# Patient Record
Sex: Female | Born: 1965 | Race: White | Hispanic: No | Marital: Married | Smoking: Never smoker
Health system: Southern US, Community
[De-identification: ages and names within clinical notes are randomized; demographics above are authoritative.]

## PROBLEM LIST (undated history)

## (undated) DIAGNOSIS — Z5189 Encounter for other specified aftercare: Secondary | ICD-10-CM

## (undated) DIAGNOSIS — Z959 Presence of cardiac and vascular implant and graft, unspecified: Secondary | ICD-10-CM

## (undated) DIAGNOSIS — M25531 Pain in right wrist: Secondary | ICD-10-CM

## (undated) DIAGNOSIS — T4145XA Adverse effect of unspecified anesthetic, initial encounter: Secondary | ICD-10-CM

## (undated) DIAGNOSIS — Q969 Turner's syndrome, unspecified: Secondary | ICD-10-CM

## (undated) DIAGNOSIS — Z974 Presence of external hearing-aid: Secondary | ICD-10-CM

## (undated) DIAGNOSIS — M160 Bilateral primary osteoarthritis of hip: Secondary | ICD-10-CM

## (undated) DIAGNOSIS — I442 Atrioventricular block, complete: Secondary | ICD-10-CM

## (undated) DIAGNOSIS — R011 Cardiac murmur, unspecified: Secondary | ICD-10-CM

## (undated) DIAGNOSIS — T8859XA Other complications of anesthesia, initial encounter: Secondary | ICD-10-CM

## (undated) DIAGNOSIS — Z8774 Personal history of (corrected) congenital malformations of heart and circulatory system: Secondary | ICD-10-CM

## (undated) DIAGNOSIS — J189 Pneumonia, unspecified organism: Secondary | ICD-10-CM

## (undated) DIAGNOSIS — D649 Anemia, unspecified: Secondary | ICD-10-CM

## (undated) DIAGNOSIS — I509 Heart failure, unspecified: Secondary | ICD-10-CM

## (undated) DIAGNOSIS — Z952 Presence of prosthetic heart valve: Secondary | ICD-10-CM

## (undated) DIAGNOSIS — R918 Other nonspecific abnormal finding of lung field: Secondary | ICD-10-CM

## (undated) DIAGNOSIS — Z9889 Other specified postprocedural states: Secondary | ICD-10-CM

## (undated) DIAGNOSIS — Z950 Presence of cardiac pacemaker: Secondary | ICD-10-CM

## (undated) DIAGNOSIS — Z8679 Personal history of other diseases of the circulatory system: Secondary | ICD-10-CM

## (undated) DIAGNOSIS — Z8709 Personal history of other diseases of the respiratory system: Secondary | ICD-10-CM

## (undated) DIAGNOSIS — I428 Other cardiomyopathies: Secondary | ICD-10-CM

## (undated) DIAGNOSIS — I5020 Unspecified systolic (congestive) heart failure: Secondary | ICD-10-CM

## (undated) DIAGNOSIS — I10 Essential (primary) hypertension: Secondary | ICD-10-CM

## (undated) DIAGNOSIS — R112 Nausea with vomiting, unspecified: Secondary | ICD-10-CM

## (undated) DIAGNOSIS — T7840XA Allergy, unspecified, initial encounter: Secondary | ICD-10-CM

## (undated) DIAGNOSIS — E785 Hyperlipidemia, unspecified: Secondary | ICD-10-CM

## (undated) DIAGNOSIS — I5042 Chronic combined systolic (congestive) and diastolic (congestive) heart failure: Secondary | ICD-10-CM

## (undated) HISTORY — DX: Presence of prosthetic heart valve: Z95.2

## (undated) HISTORY — DX: Turner's syndrome, unspecified: Q96.9

## (undated) HISTORY — DX: Other cardiomyopathies: I42.8

## (undated) HISTORY — DX: Presence of cardiac and vascular implant and graft, unspecified: Z95.9

## (undated) HISTORY — DX: Unspecified systolic (congestive) heart failure: I50.20

## (undated) HISTORY — DX: Bilateral primary osteoarthritis of hip: M16.0

## (undated) HISTORY — PX: EYE SURGERY: SHX253

## (undated) HISTORY — DX: Cardiac murmur, unspecified: R01.1

## (undated) HISTORY — PX: CARDIAC ASSIST DEVICE REMOVAL: SHX1289

## (undated) HISTORY — DX: Other nonspecific abnormal finding of lung field: R91.8

## (undated) HISTORY — DX: Allergy, unspecified, initial encounter: T78.40XA

## (undated) HISTORY — DX: Personal history of (corrected) congenital malformations of heart and circulatory system: Z87.74

## (undated) HISTORY — PX: TONSILLECTOMY: SUR1361

## (undated) HISTORY — DX: Hyperlipidemia, unspecified: E78.5

## (undated) HISTORY — PX: DENTAL SURGERY: SHX609

## (undated) HISTORY — DX: Anemia, unspecified: D64.9

## (undated) HISTORY — DX: Chronic combined systolic (congestive) and diastolic (congestive) heart failure: I50.42

## (undated) HISTORY — PX: PACEMAKER INSERTION: SHX728

## (undated) HISTORY — DX: Encounter for other specified aftercare: Z51.89

---

## 2017-01-06 DIAGNOSIS — Z8679 Personal history of other diseases of the circulatory system: Secondary | ICD-10-CM

## 2017-01-06 DIAGNOSIS — Z8674 Personal history of sudden cardiac arrest: Secondary | ICD-10-CM

## 2017-01-06 HISTORY — DX: Personal history of other diseases of the circulatory system: Z86.79

## 2017-01-06 HISTORY — DX: Personal history of sudden cardiac arrest: Z86.74

## 2017-01-11 DIAGNOSIS — R55 Syncope and collapse: Secondary | ICD-10-CM | POA: Insufficient documentation

## 2017-01-12 DIAGNOSIS — I472 Ventricular tachycardia, unspecified: Secondary | ICD-10-CM | POA: Insufficient documentation

## 2017-01-12 DIAGNOSIS — I359 Nonrheumatic aortic valve disorder, unspecified: Secondary | ICD-10-CM | POA: Insufficient documentation

## 2017-10-06 DIAGNOSIS — T827XXA Infection and inflammatory reaction due to other cardiac and vascular devices, implants and grafts, initial encounter: Secondary | ICD-10-CM | POA: Insufficient documentation

## 2017-10-06 DIAGNOSIS — I10 Essential (primary) hypertension: Secondary | ICD-10-CM | POA: Insufficient documentation

## 2017-10-06 DIAGNOSIS — I428 Other cardiomyopathies: Secondary | ICD-10-CM | POA: Insufficient documentation

## 2017-10-08 DIAGNOSIS — I469 Cardiac arrest, cause unspecified: Secondary | ICD-10-CM | POA: Insufficient documentation

## 2017-10-08 DIAGNOSIS — Z8774 Personal history of (corrected) congenital malformations of heart and circulatory system: Secondary | ICD-10-CM | POA: Insufficient documentation

## 2017-10-08 DIAGNOSIS — Z95810 Presence of automatic (implantable) cardiac defibrillator: Secondary | ICD-10-CM | POA: Insufficient documentation

## 2017-10-08 DIAGNOSIS — Q969 Turner's syndrome, unspecified: Secondary | ICD-10-CM | POA: Insufficient documentation

## 2018-02-27 ENCOUNTER — Other Ambulatory Visit: Payer: Self-pay

## 2018-02-27 ENCOUNTER — Ambulatory Visit: Payer: Managed Care, Other (non HMO) | Admitting: Family Medicine

## 2018-02-27 ENCOUNTER — Encounter: Payer: Self-pay | Admitting: Family Medicine

## 2018-02-27 VITALS — BP 118/78 | HR 100 | Temp 99.1°F | Ht 60.0 in | Wt 104.6 lb

## 2018-02-27 DIAGNOSIS — Z8774 Personal history of (corrected) congenital malformations of heart and circulatory system: Secondary | ICD-10-CM | POA: Diagnosis not present

## 2018-02-27 DIAGNOSIS — M160 Bilateral primary osteoarthritis of hip: Secondary | ICD-10-CM | POA: Diagnosis not present

## 2018-02-27 DIAGNOSIS — I428 Other cardiomyopathies: Secondary | ICD-10-CM

## 2018-02-27 DIAGNOSIS — Z952 Presence of prosthetic heart valve: Secondary | ICD-10-CM

## 2018-02-27 DIAGNOSIS — I5022 Chronic systolic (congestive) heart failure: Secondary | ICD-10-CM | POA: Diagnosis not present

## 2018-02-27 DIAGNOSIS — Z950 Presence of cardiac pacemaker: Secondary | ICD-10-CM

## 2018-02-27 DIAGNOSIS — Q969 Turner's syndrome, unspecified: Secondary | ICD-10-CM | POA: Diagnosis not present

## 2018-02-27 NOTE — Patient Instructions (Signed)
     IF you received an x-ray today, you will receive an invoice from [facility]. Please contact [facility] at [phone] with questions or concerns regarding your invoice.   IF you received labwork today, you will receive an invoice from [facility]. Please contact [facility] at [phone] with questions or concerns regarding your invoice.   Our billing staff will not be able to assist you with questions regarding bills from these companies.  You will be contacted with the lab results as soon as they are available. The fastest way to get your results is to activate your My Chart account. Instructions are located on the last page of this paperwork. If you have not heard from us regarding the results in 2 weeks, please contact this office.     

## 2018-02-27 NOTE — Progress Notes (Signed)
3/25/201910:33 AM  Michelle Lloyd 1966/04/25, 52 y.o. female [id number]  Chief Complaint  Patient presents with  . Establish Care    Needing to establish care. Also needing referral for cardiology. Has pacemaker in since 2017    HPI:   Patient is a 52 y.o. female who presents today to establish care. Needs referrals for cards, ep and ortho.  She recently moved into the area She was hospitalized in oct/nov 2018 at [facility] for removal and replacement of pacemaker due to infection Echo done at that time showed LVEF 25-30% She has not seen cardiology since then, per her insurance she needs referrals She would like to continue with EP from [facility] She also needs a referral for ortho She has bilateral hip OA, told she needs replacement, not interested, treated with stem cell injections, helped some, plan was to repeat, uses a cane Has no acute concerns today  Depression screen [facility] 2/9 02/27/2018  Decreased Interest 0  Down, Depressed, Hopeless 0  PHQ - 2 Score 0    Allergies  Allergen Reactions  . Lisinopril     Prior to Admission medications   Medication Sig Start Date End Date Taking? Authorizing Provider  co-enzyme Q-10 30 MG capsule Take 30 mg by mouth 3 (three) times daily.   Yes [provider]  ferrous sulfate 325 (65 FE) MG tablet Take 325 mg by mouth daily with breakfast.   Yes [provider]  furosemide (LASIX) 20 MG tablet Take 20 mg by mouth.   Yes [provider]  losartan (COZAAR) 25 MG tablet Take 25 mg by mouth daily.   Yes [provider]  magnesium 30 MG tablet Take 30 mg by mouth 2 (two) times daily.   Yes [provider]  metoprolol tartrate (LOPRESSOR) 25 MG tablet Take 25 mg by mouth 2 (two) times daily.   Yes [provider]  omega-3 acid ethyl esters (LOVAZA) 1 g capsule Take by mouth 2 (two) times daily.   Yes [provider]  vitamin C (ASCORBIC ACID) 500 MG tablet Take 500 mg by  mouth daily.   Yes [provider]    Past Medical History:  Diagnosis Date  . Allergy   . Anemia   . Bilateral primary osteoarthritis of hip   . Blood transfusion without reported diagnosis   . Cardiac device in situ   . H/O prosthetic aortic valve replacement   . Heart murmur   . History of repair of coarctation of aorta   . Hyperlipidemia   . Non-ischemic cardiomyopathy (HCC)   . Systolic heart failure (HCC)   . Turner syndrome     Past Surgical History:  Procedure Laterality Date  . CARDIAC ASSIST DEVICE REMOVAL    . EYE SURGERY    . PACEMAKER INSERTION      Social History   Tobacco Use  . Smoking status: Never Smoker  . Smokeless tobacco: Never Used  Substance Use Topics  . Alcohol use: Not on file    Family History  Problem Relation Age of Onset  . Cancer Mother   . Stroke Father   . Healthy Sister   . Healthy Brother     Review of Systems  Constitutional: Negative for chills, fever, malaise/fatigue and weight loss.  Respiratory: Negative for cough and shortness of breath.   Cardiovascular: Positive for palpitations (occasional). Negative for chest pain, orthopnea, leg swelling and PND.  Gastrointestinal: Negative for abdominal pain, nausea and vomiting.  Genitourinary: Negative  for dysuria and hematuria.  Musculoskeletal: Positive for joint pain.  All other systems reviewed and are negative.    OBJECTIVE:  Blood pressure 118/78, pulse 100, temperature 99.1 F (37.3 C), temperature source Oral, height 5' (1.524 m), weight 104 lb 9.6 oz (47.4 kg), SpO2 98 %.  Physical Exam  Constitutional: She is oriented to person, place, and time and well-developed, well-nourished, and in no distress.  HENT:  Head: Normocephalic and atraumatic.  Mouth/Throat: Oropharynx is clear and moist. No oropharyngeal exudate.  Eyes: Pupils are equal, round, and reactive to light. EOM are normal. No scleral icterus.  Neck: Neck supple.  Cardiovascular: Normal  rate, regular rhythm and intact distal pulses. Exam reveals no gallop and no friction rub.  Murmur heard. Pulmonary/Chest: Effort normal and breath sounds normal. She has no wheezes. She has no rales.  Musculoskeletal: She exhibits no edema.  Neurological: She is alert and oriented to person, place, and time. Gait normal.  Skin: Skin is warm and dry.    ASSESSMENT and PLAN  1. Pacemaker - Ambulatory referral to Cardiac Electrophysiology  2. Chronic systolic heart failure (HCC) - Ambulatory referral to Cardiology - Ambulatory referral to Cardiac Electrophysiology - CBC - Comprehensive metabolic panel - Lipid panel - TSH  3. Non-ischemic cardiomyopathy (HCC) - Ambulatory referral to Cardiology - Ambulatory referral to Cardiac Electrophysiology  4. Status post aortic valve replacement with prosthetic valve - Ambulatory referral to Cardiology - Ambulatory referral to Cardiac Electrophysiology  5. S/P repair of coarctation of aorta - Ambulatory referral to Cardiology - Ambulatory referral to Cardiac Electrophysiology  6. Turner syndrome - Ambulatory referral to Cardiology - Ambulatory referral to Cardiac Electrophysiology  7. Bilateral primary osteoarthritis of hip - AMB referral to orthopedics    Return in about 3 months (around 05/30/2018).    Myles Lipps, MD Primary Care at [facility] [address] [place], [place] [address] Ph.  [phone] Fax [phone]

## 2018-02-28 LAB — COMPREHENSIVE METABOLIC PANEL
ALT: 19 IU/L (ref 0–32)
AST: 26 IU/L (ref 0–40)
Albumin/Globulin Ratio: 1.7 (ref 1.2–2.2)
Albumin: 4.5 g/dL (ref 3.5–5.5)
Alkaline Phosphatase: 78 IU/L (ref 39–117)
BUN/Creatinine Ratio: 29 — ABNORMAL HIGH (ref 9–23)
BUN: 20 mg/dL (ref 6–24)
Bilirubin Total: 0.4 mg/dL (ref 0.0–1.2)
CO2: 22 mmol/L (ref 20–29)
Calcium: 9.7 mg/dL (ref 8.7–10.2)
Chloride: 100 mmol/L (ref 96–106)
Creatinine, Ser: 0.68 mg/dL (ref 0.57–1.00)
GFR calc Af Amer: 115 mL/min/{1.73_m2} (ref >59)
GFR calc non Af Amer: 99 mL/min/{1.73_m2} (ref >59)
Globulin, Total: 2.7 g/dL (ref 1.5–4.5)
Glucose: 91 mg/dL (ref 65–99)
Potassium: 4.4 mmol/L (ref 3.5–5.2)
Sodium: 140 mmol/L (ref 134–144)
Total Protein: 7.2 g/dL (ref 6.0–8.5)

## 2018-02-28 LAB — LIPID PANEL
Chol/HDL Ratio: 3 ratio (ref 0.0–4.4)
Cholesterol, Total: 238 mg/dL — ABNORMAL HIGH (ref 100–199)
HDL: 80 mg/dL (ref >39)
LDL Calculated: 131 mg/dL — ABNORMAL HIGH (ref 0–99)
Triglycerides: 135 mg/dL (ref 0–149)
VLDL Cholesterol Cal: 27 mg/dL (ref 5–40)

## 2018-02-28 LAB — CBC
Hematocrit: 37.9 % (ref 34.0–46.6)
Hemoglobin: 12.5 g/dL (ref 11.1–15.9)
MCH: 30.6 pg (ref 26.6–33.0)
MCHC: 33 g/dL (ref 31.5–35.7)
MCV: 93 fL (ref 79–97)
Platelets: 235 10*3/uL (ref 150–379)
RBC: 4.08 x10E6/uL (ref 3.77–5.28)
RDW: 13.1 % (ref 12.3–15.4)
WBC: 5.5 10*3/uL (ref 3.4–10.8)

## 2018-02-28 LAB — TSH: TSH: 1.22 u[IU]/mL (ref 0.450–4.500)

## 2018-03-07 ENCOUNTER — Ambulatory Visit (INDEPENDENT_AMBULATORY_CARE_PROVIDER_SITE_OTHER): Payer: Managed Care, Other (non HMO) | Admitting: Orthopaedic Surgery

## 2018-03-17 ENCOUNTER — Encounter: Payer: Self-pay | Admitting: Family Medicine

## 2018-04-04 ENCOUNTER — Other Ambulatory Visit: Payer: Self-pay

## 2018-04-04 MED ORDER — LOSARTAN POTASSIUM 25 MG PO TABS: 25.0000 mg | ORAL_TABLET | Freq: Every day | ORAL | 0 refills | Status: DC

## 2018-04-04 NOTE — Telephone Encounter (Signed)
30 day supply of losartan 25mg  pended for provider signature. Please refill if appropriate.

## 2018-04-04 NOTE — Telephone Encounter (Signed)
Copied from CRM [id number]. Topic: General - Other >> Apr 04, 2018  4:04 PM Elliot Gault wrote: Relation to pt: self  Call back number: losartan (COZAAR) 25 MG tablet  Pharmacy: [id number] [place], [place] - [address] [phone] (Phone) [phone] (Fax)  Reason for call:  Patient requesting a 2 week supply of losartan (COZAAR) 25 MG tablet to hold her over until new patient cardiologist appointment, patient states if PCP will not refill is okay for patient to be without for 14 days until [facility] with specialist, please advise >> Apr 04, 2018  4:11 PM Elliot Gault wrote: Relation to pt: self  Call back number: losartan (COZAAR) 25 MG tablet  Pharmacy: [id number] [place], [place] - [address] [phone] (Phone) [phone] (Fax)  Reason for call:  Patient requesting a 2 week supply of losartan (COZAAR) 25 MG tablet to hold her over until new patient cardiologist appointment, patient states if PCP will not refill is okay for patient to be without for 14 days until [facility] with specialist, please advise

## 2018-04-18 ENCOUNTER — Ambulatory Visit: Payer: Managed Care, Other (non HMO) | Admitting: Cardiovascular Disease

## 2018-04-18 ENCOUNTER — Encounter: Payer: Self-pay | Admitting: Cardiovascular Disease

## 2018-04-18 VITALS — BP 122/62 | HR 78 | Ht 60.0 in | Wt 105.6 lb

## 2018-04-18 DIAGNOSIS — Q251 Coarctation of aorta: Secondary | ICD-10-CM

## 2018-04-18 DIAGNOSIS — E7800 Pure hypercholesterolemia, unspecified: Secondary | ICD-10-CM

## 2018-04-18 DIAGNOSIS — I10 Essential (primary) hypertension: Secondary | ICD-10-CM | POA: Diagnosis not present

## 2018-04-18 DIAGNOSIS — I5022 Chronic systolic (congestive) heart failure: Secondary | ICD-10-CM

## 2018-04-18 DIAGNOSIS — I493 Ventricular premature depolarization: Secondary | ICD-10-CM

## 2018-04-18 NOTE — Patient Instructions (Addendum)
Medication Instructions:  Your physician recommends that you continue on your current medications as directed. Please refer to the Current Medication list given to you today. CALL YOUR PHARMACY TO MAKE SURE YOU LOSARTAN WAS NOT RECALLED   Labwork: NONE  Testing/Procedures: NONE  Follow-Up: Your physician wants you to follow-up in: 6 MONTH OV  You will receive a reminder letter in the mail two months in advance. If you don't receive a letter, please call our office to schedule the follow-up appointment.  DR DAUBERT'S OFFICE 05/17/18 10:00 [phone] OPT 2 IF YOU NEED TO RESCHEDULE   If you need a refill on your cardiac medications before your next appointment, please call your pharmacy.

## 2018-04-18 NOTE — Progress Notes (Signed)
Cardiology Office Note   Date:  04/18/2018   ID:  Michelle Lloyd, DOB 01-15-1966, MRN [id number]  PCP:  Myles Lipps, MD  Cardiologist:   Chilton Si, MD  Electrophysiologist: Dr. Aron Baba, [facility]  No chief complaint on file.    History of Present Illness: Michelle Lloyd is a 52 y.o. female with chronic systolic and diastolic heart failure, Turner syndrome, status post repair of coarctation of the aorta, bicuspid aortic valve status post aortic valve replacement, ventricular tachycardia s/p ICD, and hypertension who presents to establish care.  Michelle Lloyd underwent repair of coarctation of the aorta in 1980 with a gortex graft.  She developed nonischemic cardiomyopathy, most recent echo reportedly revealed LVEF 30-35%.  She is also had ventricular tachycardia and had a biventricular ICD placed 02/2011.  She had an RV lead fracture and a left innominate vein occlusion in 2016.  She underwent laser lead extraction and she subsequently underwent implantation of a right-sided Biotronik ICD 06/2015.  Her LVEF improved to 50% despite downgrade to a single lead ICD.  She had a device infection 09/2017.  Her ICD was removed and replaced at [facility].  She had a repeat echo 11/2017 that reportedly showed a reduction in her LVEF to 30%.  She is to follow up with Dr. Alden Hipp to be upgraded back to a Bi-V ICED.  Michelle Lloyd had a bicuspid aortic valve and underwent bioprosthetic AVR in 2012.  Since being discharged from [facility] Michelle Lloyd has been feeling well.  She has no lower extremity edema, orthopnea or PND.  She doesn't get much exercise 2/2 hip pain.  She hopes to get back on the treadmill and in the pool soon.  She last had cholesterol checked 02/2018 and increased Omega 3 due to elevated cholesterol levels.  She is otherwise without complaint.  She is accompanied by her husband and has 2 adopted daughters ages 4 and 64.    Past Medical History:  Diagnosis Date  . Allergy   .  Anemia   . Bilateral primary osteoarthritis of hip   . Blood transfusion without reported diagnosis   . Cardiac device in situ   . H/O prosthetic aortic valve replacement   . Heart murmur   . History of repair of coarctation of aorta   . Hyperlipidemia   . Non-ischemic cardiomyopathy (HCC)   . Systolic heart failure (HCC)   . Turner syndrome     Past Surgical History:  Procedure Laterality Date  . CARDIAC ASSIST DEVICE REMOVAL    . EYE SURGERY    . PACEMAKER INSERTION       Current Outpatient Medications  Medication Sig Dispense Refill  . co-enzyme Q-10 30 MG capsule Take 30 mg by mouth 3 (three) times daily.    . ferrous sulfate 325 (65 FE) MG tablet Take 325 mg by mouth daily with breakfast.    . furosemide (LASIX) 20 MG tablet Take 20 mg by mouth.    . losartan (COZAAR) 25 MG tablet Take 1 tablet (25 mg total) by mouth daily. 30 tablet 0  . magnesium 30 MG tablet Take 30 mg by mouth 2 (two) times daily.    . metoprolol tartrate (LOPRESSOR) 25 MG tablet Take 25 mg by mouth 2 (two) times daily.    Marland Kitchen omega-3 acid ethyl esters (LOVAZA) 1 g capsule Take by mouth 2 (two) times daily.    . vitamin C (ASCORBIC ACID) 500 MG tablet Take 500 mg by mouth daily.  No current facility-administered medications for this visit.     Allergies:   Lisinopril    Social History:  The patient  reports that she has never smoked. She has never used smokeless tobacco. She reports that she drank alcohol. She reports that she does not use drugs.   Family History:  The patient's family history includes Atrial fibrillation in her father; Breast cancer in her mother; Cataracts in her mother; Kidney disease in her sister; Stroke in her father.    ROS:  Please see the history of present illness.   Otherwise, review of systems are positive for none.   All other systems are reviewed and negative.    PHYSICAL EXAM: VS:  BP 122/62   Pulse 78   Ht 5' (1.524 m)   Wt 105 lb 9.6 oz (47.9 kg)   BMI  20.62 kg/m  , BMI Body mass index is 20.62 kg/m. GENERAL:  Well appearing.  No acute distress. HEENT:  Pupils equal round and reactive, fundi not visualized, oral mucosa unremarkable NECK:  No jugular venous distention, waveform within normal limits, carotid upstroke brisk and symmetric, no bruits.  Webbed neck.  Low posterior hairline. LUNGS:  Clear to auscultation bilaterally HEART:  RRR.  PMI not displaced or sustained,S1 and S2 within normal limits, no S3, no S4, no clicks, no rubs, III/VI systolic murmur at LUSB ABD:  Flat, positive bowel sounds normal in frequency in pitch, no bruits, no rebound, no guarding, no midline pulsatile mass, no hepatomegaly, no splenomegaly EXT:  2 plus pulses throughout, no edema, no cyanosis no clubbing.  Kyphosis SKIN:  No rashes no nodules NEURO:  Cranial nerves II through XII grossly intact, motor grossly intact throughout.   Frequent word finding difficulties.  PSYCH:  Cognitively intact, oriented to person place and time   EKG:  EKG is ordered today. The ekg ordered today demonstrates sinus rhythm.  V pacing. Rate 78 bpm.   Recent Labs: 02/27/2018: ALT 19; BUN 20; Creatinine, Ser 0.68; Hemoglobin 12.5; Platelets 235; Potassium 4.4; Sodium 140; TSH 1.220    Lipid Panel    Component Value Date/Time   CHOL 238 (H) 02/27/2018 1153   TRIG 135 02/27/2018 1153   HDL 80 02/27/2018 1153   CHOLHDL 3.0 02/27/2018 1153   LDLCALC 131 (H) 02/27/2018 1153      Wt Readings from Last 3 Encounters:  04/18/18 105 lb 9.6 oz (47.9 kg)  02/27/18 104 lb 9.6 oz (47.4 kg)      ASSESSMENT AND PLAN:  # Chronic systolic and diastolic heart failure: # VT: # S/p ICD:  Michelle Lloyd appears to be euvolemic.  Her last echo revealed reduced systolic function.  She will be going to Dr. Alden Hipp for BiV upgrade.  She will then follow up with our EP department for device management.  [facility] will certainly repeat an echo so we will not do so at this time.  # Bicuspid  aortic valve:  # S/p bioprosthetic AVR.   Echo as above.   # Coarctation of the aorta: S/p Repair.  Unclear when this was last imaged.  We will get her records and repeat CT-A of the chest if she hasn't had one recently.  # Hypertension: BP initially elevated and improved on repeat.  No difference in either arm.  Continue losartan and metoprolol.   # Hyperlipidemia: ASCVD 10 year risk 1.7%.  Continue omega 3.    Current medicines are reviewed at length with the patient today.  The patient does  not have concerns regarding medicines.  The following changes have been made:  no change  Labs/ tests ordered today include:  No orders of the defined types were placed in this encounter.    Disposition:   FU with Mckenzi Buonomo C. [facility] Salvia, MD, [facility] in 6 months.      Signed, Monicka Cyran C. [facility] Salvia, MD, [facility]  04/18/2018 5:35 PM    [facility]

## 2018-05-30 ENCOUNTER — Ambulatory Visit: Payer: Managed Care, Other (non HMO) | Admitting: Family Medicine

## 2018-06-06 ENCOUNTER — Ambulatory Visit: Payer: Self-pay | Admitting: Family Medicine

## 2018-07-06 DIAGNOSIS — Z959 Presence of cardiac and vascular implant and graft, unspecified: Secondary | ICD-10-CM

## 2018-07-06 HISTORY — DX: Presence of cardiac and vascular implant and graft, unspecified: Z95.9

## 2018-07-26 ENCOUNTER — Telehealth: Payer: Self-pay | Admitting: Cardiovascular Disease

## 2018-07-26 NOTE — Telephone Encounter (Signed)
Spoke to patient she states she was not sure who she was ask about  The timeframe   For hip surgery. Patient states  [facility] physician was aware but informed her  To "lets get  bivi upgrade " she has an post follow up appointment in sept 2019. RN informed patient to ask  Dr Alden Hipp ( [facility]) at next appointment)  patient aware will also defer question to Dr [facility] Salvia.

## 2018-07-26 NOTE — Telephone Encounter (Signed)
New Message    Patient is calling because she just recently had a pacemaker. She wants to know what the timeframe should be before she has a hip replacement. Please call.

## 2018-07-27 ENCOUNTER — Other Ambulatory Visit: Payer: Self-pay

## 2018-07-27 ENCOUNTER — Ambulatory Visit: Payer: Managed Care, Other (non HMO) | Admitting: Family Medicine

## 2018-07-27 ENCOUNTER — Encounter: Payer: Self-pay | Admitting: Family Medicine

## 2018-07-27 VITALS — BP 108/62 | HR 64 | Temp 98.5°F | Ht 60.0 in | Wt 106.2 lb

## 2018-07-27 DIAGNOSIS — M160 Bilateral primary osteoarthritis of hip: Secondary | ICD-10-CM

## 2018-07-27 DIAGNOSIS — I10 Essential (primary) hypertension: Secondary | ICD-10-CM | POA: Diagnosis not present

## 2018-07-27 DIAGNOSIS — Z95810 Presence of automatic (implantable) cardiac defibrillator: Secondary | ICD-10-CM

## 2018-07-27 NOTE — Patient Instructions (Signed)
° ° ° °  If you have lab work done today you will be contacted with your lab results within the next 2 weeks.  If you have not heard from us then please contact us. The fastest way to get your results is to register for My Chart. ° ° °IF you received an x-ray today, you will receive an invoice from [facility]. Please contact [facility] at [phone] with questions or concerns regarding your invoice.  ° °IF you received labwork today, you will receive an invoice from [facility]. Please contact [facility] at [phone] with questions or concerns regarding your invoice.  ° °Our billing staff will not be able to assist you with questions regarding bills from these companies. ° °You will be contacted with the lab results as soon as they are available. The fastest way to get your results is to activate your My Chart account. Instructions are located on the last page of this paperwork. If you have not heard from us regarding the results in 2 weeks, please contact this office. °  ° ° ° °

## 2018-07-27 NOTE — Progress Notes (Signed)
8/22/201910:09 AM  Michelle Lloyd 1966-11-08, 52 y.o. female [id number]  Chief Complaint  Patient presents with  . Wound Check    pacemaker put in on the 1st of Aug, need wound chk. Has surgical strips on today. they are to fall off on its on    HPI:   Patient is a 52 y.o. female with past medical history significant for turners syndrome, repair of coarctation of aorta, HFrEF - 30%, h/o ICD in place, hip OA who presents today for followup  Patient had infection of original pacemaker, reimplanted with single chamber ICD However had declining EF and increased sx so ICD upgraded to biventricular ICD, right chest, on 07/06/18  Patient is feeling well Denies any concerns for wound infection Steri-stripes still in place  Dr Alden Hipp, EP at [facility]  Last viist 07/06/18, sees him again in Sept Dr Chilton Si, cards  Requesting referral for ortho for hip replacement eval, known bilateral OA, left worse than right Uses a cane for walking Per patient, she should be ok after 6 weeks pacemaker placement  Fall Risk  07/27/2018 02/27/2018  Falls in the past year? No No     Depression screen [facility] 2/9 07/27/2018 02/27/2018  Decreased Interest 0 0  Down, Depressed, Hopeless 0 0  PHQ - 2 Score 0 0    Allergies  Allergen Reactions  . Lisinopril     Prior to Admission medications   Medication Sig Start Date End Date Taking? Authorizing Provider  co-enzyme Q-10 30 MG capsule Take 30 mg by mouth 3 (three) times daily.   Yes [provider]  ferrous sulfate 325 (65 FE) MG tablet Take 325 mg by mouth daily with breakfast.   Yes [provider]  furosemide (LASIX) 20 MG tablet Take 20 mg by mouth.   Yes [provider]  losartan (COZAAR) 25 MG tablet Take 1 tablet (25 mg total) by mouth daily. 04/04/18  Yes Myles Lipps, MD  magnesium 30 MG tablet Take 30 mg by mouth 2 (two) times daily.   Yes [provider]  metoprolol tartrate (LOPRESSOR) 25 MG  tablet Take 25 mg by mouth 2 (two) times daily.   Yes [provider]  omega-3 acid ethyl esters (LOVAZA) 1 g capsule Take by mouth 2 (two) times daily.   Yes [provider]  vitamin C (ASCORBIC ACID) 500 MG tablet Take 500 mg by mouth daily.   Yes [provider]    Past Medical History:  Diagnosis Date  . Allergy   . Anemia   . Bilateral primary osteoarthritis of hip   . Blood transfusion without reported diagnosis   . Cardiac device in situ   . H/O prosthetic aortic valve replacement   . Heart murmur   . History of repair of coarctation of aorta   . Hyperlipidemia   . Non-ischemic cardiomyopathy (HCC)   . Systolic heart failure (HCC)   . Turner syndrome   . Turner's syndrome     Past Surgical History:  Procedure Laterality Date  . CARDIAC ASSIST DEVICE REMOVAL    . EYE SURGERY    . PACEMAKER INSERTION      Social History   Tobacco Use  . Smoking status: Never Smoker  . Smokeless tobacco: Never Used  Substance Use Topics  . Alcohol use: Not Currently    Family History  Problem Relation Age of Onset  . Breast cancer Mother   . Cataracts Mother   . Stroke Father   .  Atrial fibrillation Father   . Kidney disease Sister     Review of Systems  Constitutional: Negative for chills, fever and malaise/fatigue.  Respiratory: Negative for cough and shortness of breath.   Cardiovascular: Negative for chest pain, palpitations and leg swelling.  Gastrointestinal: Negative for abdominal pain, nausea and vomiting.     OBJECTIVE:  Blood pressure 108/62, pulse 64, temperature 98.5 F (36.9 C), temperature source Oral, height 5' (1.524 m), weight 106 lb 3.2 oz (48.2 kg), SpO2 99 %. Body mass index is 20.74 kg/m.   Physical Exam  Constitutional: She is oriented to person, place, and time. She appears well-developed and well-nourished.  HENT:  Head: Normocephalic and atraumatic.  Mouth/Throat: Oropharynx is clear and moist. No oropharyngeal  exudate.  Eyes: Pupils are equal, round, and reactive to light. EOM are normal. No scleral icterus.  Neck: Neck supple.  Cardiovascular: Normal rate, regular rhythm and normal heart sounds. Exam reveals no gallop and no friction rub.  No murmur heard. Pulmonary/Chest: Effort normal and breath sounds normal. She has no wheezes. She has no rales.  Musculoskeletal: She exhibits no edema.  Neurological: She is alert and oriented to person, place, and time. Gait abnormal.  Skin: Skin is warm and dry.  Right upper chest, steri-stripes c/d/i. No erythema, swelling, warmth or drainage.   Nursing note and vitals reviewed.   ASSESSMENT and PLAN   1. ICD (implantable cardioverter-defibrillator) in place Healing well. No signs of infection today. followup with EP and cards as scheduled  2. Bilateral primary osteoarthritis of hip Patient interested in pursuing hip replacements if possible. Referring to ortho as requested. - Ambulatory referral to Orthopedic Surgery  3. Essential hypertension Controlled. Continue current regime.   Return in about 6 months (around 01/27/2019).    Myles Lipps, MD Primary Care at [facility] [address] [place], [place] [address] Ph.  [phone] Fax [phone]

## 2018-07-28 NOTE — Telephone Encounter (Signed)
Advised patient, verbalized understanding  

## 2018-07-28 NOTE — Telephone Encounter (Signed)
Yes, this is up to her Electrophysiologist, Dr. Alden Hipp.

## 2018-09-21 DIAGNOSIS — M25559 Pain in unspecified hip: Secondary | ICD-10-CM | POA: Insufficient documentation

## 2018-09-22 ENCOUNTER — Telehealth: Payer: Self-pay | Admitting: Cardiovascular Disease

## 2018-09-22 NOTE — Telephone Encounter (Signed)
Tramadol is fine to take with her current cardiac medications.

## 2018-09-22 NOTE — Telephone Encounter (Signed)
Called patient advised of note from PharmD, patient verbalized understanding.

## 2018-09-22 NOTE — Telephone Encounter (Signed)
New message:       Pt c/o medication issue:  1. Name of Medication: Tramadol  2. How are you currently taking this medication (dosage and times per day)?  3. Are you having a reaction (difficulty breathing--STAT)? No  4. What is your medication issue? Pt would like to know if this is okay to take this medication with her other meds due to her heart condition.

## 2018-09-22 NOTE — Telephone Encounter (Signed)
Please advise, thanks.

## 2018-09-27 ENCOUNTER — Ambulatory Visit: Payer: Managed Care, Other (non HMO) | Admitting: Family Medicine

## 2018-09-27 ENCOUNTER — Encounter: Payer: Self-pay | Admitting: Family Medicine

## 2018-09-27 ENCOUNTER — Other Ambulatory Visit: Payer: Self-pay

## 2018-09-27 ENCOUNTER — Encounter

## 2018-09-27 VITALS — BP 115/69 | HR 83 | Temp 98.0°F | Resp 16 | Ht 60.0 in | Wt 106.0 lb

## 2018-09-27 DIAGNOSIS — Z95810 Presence of automatic (implantable) cardiac defibrillator: Secondary | ICD-10-CM | POA: Diagnosis not present

## 2018-09-27 DIAGNOSIS — Q969 Turner's syndrome, unspecified: Secondary | ICD-10-CM

## 2018-09-27 DIAGNOSIS — M160 Bilateral primary osteoarthritis of hip: Secondary | ICD-10-CM | POA: Diagnosis not present

## 2018-09-27 DIAGNOSIS — I5022 Chronic systolic (congestive) heart failure: Secondary | ICD-10-CM

## 2018-09-27 NOTE — Progress Notes (Signed)
10/23/201910:45 AM  Michelle Lloyd 04-21-1966, 52 y.o. female [id number]  Chief Complaint  Patient presents with  . Pacemaker Check    2 week follow-up, pt states she went and has some leads replaced on her pacemake.     HPI:   Patient is a 52 y.o. female with past medical history significant for turners syndrome, repair of coarctation of aorta, HFrEF - 30%, h/o ICD in place, hip OA who presents today for followup  Dr Jean Rosenthal at [facility] on 09/01/18 had a LV lead revision, discharge summary reviewed Right upper chest  Main EP Dr Alden Hipp - sees him in Nov 7th Main Cards Dr [facility] Salvia  Scheduled for hip surgery in Dec, will start with the left No fever or chills, redness, warmth, drainage No chest pain, palpitations, SOB, edema No pain  Fall Risk  09/27/2018 07/27/2018 02/27/2018  Falls in the past year? No No No     Depression screen [facility] 2/9 09/27/2018 07/27/2018 02/27/2018  Decreased Interest 0 0 0  Down, Depressed, Hopeless 0 0 0  PHQ - 2 Score 0 0 0    Allergies  Allergen Reactions  . Lisinopril     Prior to Admission medications   Medication Sig Start Date End Date Taking? Authorizing Provider  co-enzyme Q-10 30 MG capsule Take 30 mg by mouth 3 (three) times daily.   Yes [provider]  ferrous sulfate 325 (65 FE) MG tablet Take 325 mg by mouth daily with breakfast.   Yes [provider]  furosemide (LASIX) 20 MG tablet Take 20 mg by mouth.   Yes [provider]  losartan (COZAAR) 25 MG tablet Take 1 tablet (25 mg total) by mouth daily. 04/04/18  Yes Myles Lipps, MD  magnesium 30 MG tablet Take 30 mg by mouth 2 (two) times daily.   Yes [provider]  metoprolol tartrate (LOPRESSOR) 25 MG tablet Take 25 mg by mouth 2 (two) times daily.   Yes [provider]  omega-3 acid ethyl esters (LOVAZA) 1 g capsule Take by mouth 2 (two) times daily.   Yes [provider]  vitamin C (ASCORBIC ACID) 500 MG tablet Take 500  mg by mouth daily.   Yes [provider]    Past Medical History:  Diagnosis Date  . Allergy   . Anemia   . Bilateral primary osteoarthritis of hip   . Blood transfusion without reported diagnosis   . Cardiac device in situ 07/06/2018   Biotronik BIVICD , right chest  . H/O prosthetic aortic valve replacement   . Heart murmur   . History of repair of coarctation of aorta   . Hyperlipidemia   . Non-ischemic cardiomyopathy (HCC)   . Systolic heart failure (HCC)   . Turner syndrome   . Turner's syndrome     Past Surgical History:  Procedure Laterality Date  . CARDIAC ASSIST DEVICE REMOVAL    . EYE SURGERY    . PACEMAKER INSERTION      Social History   Tobacco Use  . Smoking status: Never Smoker  . Smokeless tobacco: Never Used  Substance Use Topics  . Alcohol use: Not Currently    Family History  Problem Relation Age of Onset  . Breast cancer Mother   . Cataracts Mother   . Stroke Father   . Atrial fibrillation Father   . Kidney disease Sister     ROS Per hpi  OBJECTIVE:  Blood pressure 115/69, pulse 83, temperature 98 F (36.7 C),  temperature source Oral, resp. rate 16, height 5' (1.524 m), weight 106 lb (48.1 kg), SpO2 97 %. Body mass index is 20.7 kg/m.   Wt Readings from Last 3 Encounters:  09/27/18 106 lb (48.1 kg)  07/27/18 106 lb 3.2 oz (48.2 kg)  04/18/18 105 lb 9.6 oz (47.9 kg)    Physical Exam  Constitutional: She is oriented to person, place, and time. She appears well-developed and well-nourished.  HENT:  Head: Normocephalic and atraumatic.  Mouth/Throat: Oropharynx is clear and moist. No oropharyngeal exudate.  Eyes: Pupils are equal, round, and reactive to light. Conjunctivae and EOM are normal. No scleral icterus.  Neck: Neck supple.  Cardiovascular: Normal rate and regular rhythm. Exam reveals no gallop and no friction rub.  Murmur heard. Pulmonary/Chest: Effort normal and breath sounds normal. She has no wheezes. She has no  rales.  Musculoskeletal: She exhibits no edema.  Neurological: She is alert and oriented to person, place, and time.  Skin: Skin is warm and dry.  Right upper chest Steri-strips still in place, very adhered, dry No erythema or warmth No drainage  Psychiatric: She has a normal mood and affect.  Nursing note and vitals reviewed.   ASSESSMENT and PLAN  1. ICD (implantable cardioverter-defibrillator) in place 2. Chronic systolic heart failure (HCC) 3. Bilateral primary osteoarthritis of hip 4. Turner syndrome  Overall doing well. No signs of infection at ICD site. Euvolemic today.  Discussed she will need cards clearance for hip replacement. She is to make an appt with Dr [facility] Salvia to further discuss.   Return in about 6 weeks (around 11/08/2018) for pre-op.    Myles Lipps, MD Primary Care at [facility] [address] [place], [place] [address] Ph.  [phone] Fax [phone]

## 2018-09-27 NOTE — Patient Instructions (Signed)
° ° ° °  If you have lab work done today you will be contacted with your lab results within the next 2 weeks.  If you have not heard from us then please contact us. The fastest way to get your results is to register for My Chart. ° ° °IF you received an x-ray today, you will receive an invoice from [facility]. Please contact [facility] at [phone] with questions or concerns regarding your invoice.  ° °IF you received labwork today, you will receive an invoice from [facility]. Please contact [facility] at [phone] with questions or concerns regarding your invoice.  ° °Our billing staff will not be able to assist you with questions regarding bills from these companies. ° °You will be contacted with the lab results as soon as they are available. The fastest way to get your results is to activate your My Chart account. Instructions are located on the last page of this paperwork. If you have not heard from us regarding the results in 2 weeks, please contact this office. °  ° ° ° °

## 2018-10-03 ENCOUNTER — Ambulatory Visit: Payer: Managed Care, Other (non HMO) | Admitting: Cardiology

## 2018-10-11 ENCOUNTER — Telehealth: Payer: Self-pay | Admitting: Cardiovascular Disease

## 2018-10-11 NOTE — Telephone Encounter (Signed)
New Message:     Pt wants to know if you received a surgical clearance from [facility] and what is the status of it

## 2018-10-12 NOTE — Telephone Encounter (Signed)
Will route to callback to investigate, I do not see anything. Dayna Dunn PA-C

## 2018-10-12 NOTE — Telephone Encounter (Signed)
Returned pts call and left a detailed message that we have not received a surgical clearance on her behalf from [facility] and if she needed to speak to me, she could call back.

## 2018-10-24 ENCOUNTER — Telehealth: Payer: Self-pay

## 2018-10-24 NOTE — Telephone Encounter (Signed)
   [facility] Pre-operative Risk Assessment    Request for surgical clearance:  1. What type of surgery is being performed? Left total hip  2. When is this surgery scheduled? 11/27/18  3. What type of clearance is required (medical clearance vs. Pharmacy clearance to hold med vs. Both)? Medical  4. Are there any medications that need to be held prior to surgery and how long?None  5. Practice name and name of physician performing surgery? [facility]  Dr.Aluisio   6. What is your office phone number [phone]   7.   What is your office fax number [phone]  8.   Anesthesia type  Choice   Neoma Laming 10/24/2018, 5:59 PM  _________________________________________________________________   (provider comments below)

## 2018-10-25 NOTE — Telephone Encounter (Signed)
Will route to pharmacy for SBE recommendations, then contact patient for clearance.  Corine Shelter PA-C 10/25/2018 4:40 PM

## 2018-10-25 NOTE — Telephone Encounter (Signed)
Our protocol does not recommend SBE for ortho procedures.

## 2018-10-26 NOTE — Telephone Encounter (Signed)
   Primary Cardiologist: Chilton Si, MD  Chart reviewed as part of pre-operative protocol coverage. Given past medical history and time since last visit, based on ACC/AHA guidelines, Charlann Christians would be at acceptable risk for the planned procedure without further cardiovascular testing.   No SBE prophylaxis needed per our protocol.   I will route this recommendation to the requesting party via Epic fax function and remove from pre-op pool.  Please call with questions.  Corine Shelter, PA-C 10/26/2018, 3:06 PM

## 2018-10-26 NOTE — Telephone Encounter (Signed)
LMTCB

## 2018-11-01 ENCOUNTER — Ambulatory Visit (INDEPENDENT_AMBULATORY_CARE_PROVIDER_SITE_OTHER): Payer: Managed Care, Other (non HMO) | Admitting: Cardiovascular Disease

## 2018-11-01 ENCOUNTER — Encounter: Payer: Self-pay | Admitting: Cardiovascular Disease

## 2018-11-01 VITALS — BP 100/60 | HR 80 | Ht 60.0 in | Wt 103.2 lb

## 2018-11-01 DIAGNOSIS — I5022 Chronic systolic (congestive) heart failure: Secondary | ICD-10-CM

## 2018-11-01 DIAGNOSIS — E7800 Pure hypercholesterolemia, unspecified: Secondary | ICD-10-CM | POA: Diagnosis not present

## 2018-11-01 DIAGNOSIS — Q251 Coarctation of aorta: Secondary | ICD-10-CM | POA: Diagnosis not present

## 2018-11-01 DIAGNOSIS — I493 Ventricular premature depolarization: Secondary | ICD-10-CM | POA: Diagnosis not present

## 2018-11-01 DIAGNOSIS — I10 Essential (primary) hypertension: Secondary | ICD-10-CM

## 2018-11-01 DIAGNOSIS — Z01818 Encounter for other preprocedural examination: Secondary | ICD-10-CM

## 2018-11-01 DIAGNOSIS — Z953 Presence of xenogenic heart valve: Secondary | ICD-10-CM

## 2018-11-01 NOTE — Progress Notes (Signed)
Cardiology Office Note   Date:  11/01/2018   ID:  Michelle Lloyd, DOB 08-06-1966, MRN [id number]  PCP:  Myles Lipps, MD  Cardiologist:   Chilton Si, MD  Electrophysiologist: Dr. Aron Baba, [facility]  Chief Complaint  Patient presents with  . Follow-up    pt about hip replacement 12/23     History of Present Illness: Michelle Lloyd is a 52 y.o. female with chronic systolic and diastolic heart failure, Turner syndrome, status post repair of coarctation of the aorta, bicuspid aortic valve status post aortic valve replacement, ventricular tachycardia s/p ICD, and hypertension who presents to establish care.  Michelle Lloyd underwent repair of coarctation of the aorta in 1980 with a gortex graft.  She developed nonischemic cardiomyopathy, most recent echo reportedly revealed LVEF 30-35%.  She is also had ventricular tachycardia and had a biventricular ICD placed 02/2011.  She had an RV lead fracture and a left innominate vein occlusion in 2016.  She underwent laser lead extraction and she subsequently underwent implantation of a right-sided Biotronik ICD 06/2015.  Her LVEF improved to 50% despite downgrade to a single lead ICD.  She had a device infection 09/2017.  Her ICD was removed and replaced at [facility].  She had a repeat echo 11/2017 that reportedly showed a reduction in her LVEF to 30%.  She is to follow up with Dr. Alden Hipp to be upgraded back to a Bi-V ICED.  Michelle Lloyd had a bicuspid aortic valve and underwent bioprosthetic AVR in 2012.  Since being discharged from [facility] Michelle Lloyd has been feeling well.  She has no lower extremity edema, orthopnea or PND.  She doesn't get much exercise 2/2 hip pain.  She hopes to get back on the treadmill and in the pool soon.  She last had cholesterol checked 02/2018 and increased Omega 3 due to elevated cholesterol levels.  She is otherwise without complaint.  She is accompanied by her husband and has 2 adopted daughters ages 7 and 29.    Michelle Lloyd had an echo at [facility] 05/2018 that revealed LVEF 30% with global hypokinesis with exception of normal wall motion in the posterior myocardium and akinesis of the septum.  Since her last appointment Michelle Lloyd underwent BiV upgratd 07/2018.  Her device is on the R.  At her post op device check the LV lead was not capturing.  She had a lead revision 09/02/18.  Since that time she has been doing well.  She feels a little tired but she attributes this to not sleeping well.  She is scheduled to have her hip replaced with Dr. Despina Hick on 12/23.  She is unable to get much exercise because of the pain in her hip.  She was able to walk for an hour without chest pain or shortness of breath.  The hip is her only limitation.  She is unable to walk upstairs as of the pain.  She has no lower extremity edema, orthopnea, or PND.   Past Medical History:  Diagnosis Date  . Allergy   . Anemia   . Bilateral primary osteoarthritis of hip   . Blood transfusion without reported diagnosis   . Cardiac device in situ 07/06/2018   Biotronik BIVICD , right chest  . H/O prosthetic aortic valve replacement   . Heart murmur   . History of repair of coarctation of aorta   . Hyperlipidemia   . Non-ischemic cardiomyopathy (HCC)   . Systolic heart failure (HCC)   . Turner syndrome   .  Turner's syndrome     Past Surgical History:  Procedure Laterality Date  . CARDIAC ASSIST DEVICE REMOVAL    . EYE SURGERY    . PACEMAKER INSERTION       Current Outpatient Medications  Medication Sig Dispense Refill  . co-enzyme Q-10 30 MG capsule Take 30 mg by mouth 3 (three) times daily.    . ferrous sulfate 325 (65 FE) MG tablet Take 325 mg by mouth daily with breakfast.    . furosemide (LASIX) 20 MG tablet Take 20 mg by mouth.    . losartan (COZAAR) 25 MG tablet Take 1 tablet (25 mg total) by mouth daily. 30 tablet 0  . magnesium 30 MG tablet Take 30 mg by mouth 2 (two) times daily.    . metoprolol succinate  (TOPROL-XL) 25 MG 24 hr tablet Take 25 mg by mouth daily.    Marland Kitchen omega-3 acid ethyl esters (LOVAZA) 1 g capsule Take by mouth 2 (two) times daily.    . traMADol (ULTRAM) 50 MG tablet tramadol 50 mg tablet  1-2 po bid prn pain    . vitamin C (ASCORBIC ACID) 500 MG tablet Take 500 mg by mouth daily.     No current facility-administered medications for this visit.     Allergies:   Lisinopril    Social History:  The patient  reports that she has never smoked. She has never used smokeless tobacco. She reports that she drank alcohol. She reports that she does not use drugs.   Family History:  The patient's family history includes Atrial fibrillation in her father; Breast cancer in her mother; Cataracts in her mother; Kidney disease in her sister; Stroke in her father.    ROS:  Please see the history of present illness.   Otherwise, review of systems are positive for none.   All other systems are reviewed and negative.    PHYSICAL EXAM: VS:  BP 100/60   Pulse 80   Ht 5' (1.524 m)   Wt 103 lb 3.2 oz (46.8 kg)   BMI 20.15 kg/m  , BMI Body mass index is 20.15 kg/m. GENERAL:  Well appearing.  No acute distress. HEENT:  Pupils equal round and reactive, fundi not visualized, oral mucosa unremarkable NECK:  No jugular venous distention, waveform within normal limits, carotid upstroke brisk and symmetric, no bruits.  Webbed neck.  Low posterior hairline. LUNGS:  Clear to auscultation bilaterally HEART:  RRR.  PMI not displaced or sustained,S1 and S2 within normal limits, no S3, no S4, no clicks, no rubs, III/VI systolic murmur at LUSB ABD:  Flat, positive bowel sounds normal in frequency in pitch, no bruits, no rebound, no guarding, no midline pulsatile mass, no hepatomegaly, no splenomegaly EXT:  2 plus pulses throughout, no edema, no cyanosis no clubbing.  Kyphosis SKIN:  No rashes no nodules NEURO:  Cranial nerves II through XII grossly intact, motor grossly intact throughout.   Frequent word  finding difficulties.  PSYCH:  Cognitively intact, oriented to person place and time   EKG:  EKG is ordered today. The ekg ordered 04/18/18 demonstrates sinus rhythm.  V pacing. Rate 78 bpm. 11/01/18: Sinus rhythm.  ASVP.  Rate 80 bpm.  Long AV delay.  BIV pacing.     Recent Labs: 02/27/2018: ALT 19; BUN 20; Creatinine, Ser 0.68; Hemoglobin 12.5; Platelets 235; Potassium 4.4; Sodium 140; TSH 1.220    Lipid Panel    Component Value Date/Time   CHOL 238 (H) 02/27/2018 1153   TRIG  135 02/27/2018 1153   HDL 80 02/27/2018 1153   CHOLHDL 3.0 02/27/2018 1153   LDLCALC 131 (H) 02/27/2018 1153      Wt Readings from Last 3 Encounters:  11/01/18 103 lb 3.2 oz (46.8 kg)  09/27/18 106 lb (48.1 kg)  07/27/18 106 lb 3.2 oz (48.2 kg)      ASSESSMENT AND PLAN:  # Chronic systolic and diastolic heart failure: # VT: # S/p Bi-V ICD:  LVEF 30% pre BI-V upgrade.  She is euvolemic and doing well.  Her device [facility] implanted [facility].  She has 1 more follow-up with them and then will likely follow up with our EP team for device management.  Continue metoprolol, furosemide, and losartan.  If she develops symptomatic heart failure would switch losartan to Entresto.  # Pre-op: The patient does not have any unstable cardiac conditions.  Upon evaluation today, she can achieve 4 METs or greater without anginal symptoms.  According to [facility] and AHA guidelines, she requires no further cardiac workup prior to her noncardiac surgery and should be at acceptable risk.  her NSQIP risk of peri-procedural MI or cardiac arrest is 0.02%.  Our service is available as necessary in the perioperative period.  # Bicuspid aortic valve:  # S/p bioprosthetic AVR.   # Moderate prosthetic valve stenosis: Stable on echo 05/2018.  Mean aortic valve gradient 37 mmHg.  Gradients were unchanged from prior study 10/2017.  However the gradient is consistent with moderate stenosis.  I am unable to view that echo in our system.  She has no  evidence of heart failure on exam.  Would repeat echo at follow up.   # Coarctation of the aorta: S/p Repair.  No re-stenosis on chest CT-A 10/2017.  # Hypertension: BP controlled on losartan and metoprolol.   # Hyperlipidemia: ASCVD 10 year risk 1.7%.  Continue omega 3.    Current medicines are reviewed at length with the patient today.  The patient does not have concerns regarding medicines.  The following changes have been made:  no change  Labs/ tests ordered today include:  No orders of the defined types were placed in this encounter.    Disposition:   FU with Angla Delahunt C. [facility] Salvia, MD, [facility] in 6 months.      Signed, Maki Hege C. [facility] Salvia, MD, [facility]  11/01/2018 3:06 PM    [facility]

## 2018-11-01 NOTE — Patient Instructions (Addendum)

## 2018-11-07 NOTE — Addendum Note (Signed)
Addended by: Barrie Dunker on: 11/07/2018 09:14 AM   Modules accepted: Orders

## 2018-11-08 ENCOUNTER — Ambulatory Visit: Payer: Managed Care, Other (non HMO) | Admitting: Family Medicine

## 2018-11-17 NOTE — Patient Instructions (Addendum)
Michelle Lloyd  11/17/2018   Your procedure is scheduled on: 11-27-18   Report to [facility] Main  Entrance             Report to admitting at     110  PM    Call this number if you have problems the morning of surgery [phone]    Remember: Do not eat food:After Midnight  You may have clear liquids until 0850 then nothing by mouth  . BRUSH YOUR TEETH MORNING OF SURGERY AND RINSE YOUR MOUTH OUT, NO CHEWING GUM CANDY OR MINTS.     Take these medicines the morning of surgery with A SIP OF WATER: metoprolol                                You may not have any metal on your body including hair pins and              piercings  Do not wear jewelry, make-up, lotions, powders or perfumes, deodorant             Do not wear nail polish.  Do not shave  48 hours prior to surgery.     Do not bring valuables to the hospital. [place] IS NOT             RESPONSIBLE   FOR VALUABLES.  Contacts, dentures or bridgework may not be worn into surgery.  Leave suitcase in the car. After surgery it may be brought to your room.                Please read over the following fact sheets you were given: _____________________________________________________________________          [facility] - Preparing for Surgery Before surgery, you can play an important role.  Because skin is not sterile, your skin needs to be as free of germs as possible.  You can reduce the number of germs on your skin by washing with CHG (chlorahexidine gluconate) soap before surgery.  CHG is an antiseptic cleaner which kills germs and bonds with the skin to continue killing germs even after washing. Please DO NOT use if you have an allergy to CHG or antibacterial soaps.  If your skin becomes reddened/irritated stop using the CHG and inform your nurse when you arrive at Short Stay. Do not shave (including legs and underarms) for at least 48 hours prior to the first CHG shower.  You may shave your  face/neck. Please follow these instructions carefully:  1.  Shower with CHG Soap the night before surgery and the  morning of Surgery.  2.  If you choose to wash your hair, wash your hair first as usual with your  normal  shampoo.  3.  After you shampoo, rinse your hair and body thoroughly to remove the  shampoo.                           4.  Use CHG as you would any other liquid soap.  You can apply chg directly  to the skin and wash                       Gently with a scrungie or clean washcloth.  5.  Apply the CHG Soap to your  body ONLY FROM THE NECK DOWN.   Do not use on face/ open                           Wound or open sores. Avoid contact with eyes, ears mouth and genitals (private parts).                       Wash face,  Genitals (private parts) with your normal soap.             6.  Wash thoroughly, paying special attention to the area where your surgery  will be performed.  7.  Thoroughly rinse your body with warm water from the neck down.  8.  DO NOT shower/wash with your normal soap after using and rinsing off  the CHG Soap.                9.  Pat yourself dry with a clean towel.            10.  Wear clean pajamas.            11.  Place clean sheets on your bed the night of your first shower and do not  sleep with pets. Day of Surgery : Do not apply any lotions/deodorants the morning of surgery.  Please wear clean clothes to the hospital/surgery center.  FAILURE TO FOLLOW THESE INSTRUCTIONS MAY RESULT IN THE CANCELLATION OF YOUR SURGERY PATIENT SIGNATURE_________________________________  NURSE SIGNATURE__________________________________  ________________________________________________________________________  WHAT IS A BLOOD TRANSFUSION? Blood Transfusion Information  A transfusion is the replacement of blood or some of its parts. Blood is made up of multiple cells which provide different functions.  Red blood cells carry oxygen and are used for blood loss  replacement.  White blood cells fight against infection.  Platelets control bleeding.  Plasma helps clot blood.  Other blood products are available for specialized needs, such as hemophilia or other clotting disorders. BEFORE THE TRANSFUSION  Who gives blood for transfusions?   Healthy volunteers who are fully evaluated to make sure their blood is safe. This is blood bank blood. Transfusion therapy is the safest it has ever been in the practice of medicine. Before blood is taken from a donor, a complete history is taken to make sure that person has no history of diseases nor engages in risky social behavior (examples are intravenous drug use or sexual activity with multiple partners). The donor's travel history is screened to minimize risk of transmitting infections, such as malaria. The donated blood is tested for signs of infectious diseases, such as HIV and hepatitis. The blood is then tested to be sure it is compatible with you in order to minimize the chance of a transfusion reaction. If you or a relative donates blood, this is often done in anticipation of surgery and is not appropriate for emergency situations. It takes many days to process the donated blood. RISKS AND COMPLICATIONS Although transfusion therapy is very safe and saves many lives, the main dangers of transfusion include:   Getting an infectious disease.  Developing a transfusion reaction. This is an allergic reaction to something in the blood you were given. Every precaution is taken to prevent this. The decision to have a blood transfusion has been considered carefully by your caregiver before blood is given. Blood is not given unless the benefits outweigh the risks. AFTER THE TRANSFUSION  Right after receiving a blood transfusion, you will usually feel  much better and more energetic. This is especially true if your red blood cells have gotten low (anemic). The transfusion raises the level of the red blood cells which  carry oxygen, and this usually causes an energy increase.  The nurse administering the transfusion will monitor you carefully for complications. HOME CARE INSTRUCTIONS  No special instructions are needed after a transfusion. You may find your energy is better. Speak with your caregiver about any limitations on activity for underlying diseases you may have. SEEK MEDICAL CARE IF:   Your condition is not improving after your transfusion.  You develop redness or irritation at the intravenous (IV) site. SEEK IMMEDIATE MEDICAL CARE IF:  Any of the following symptoms occur over the next 12 hours:  Shaking chills.  You have a temperature by mouth above 102 F (38.9 C), not controlled by medicine.  Chest, back, or muscle pain.  People around you feel you are not acting correctly or are confused.  Shortness of breath or difficulty breathing.  Dizziness and fainting.  You get a rash or develop hives.  You have a decrease in urine output.  Your urine turns a dark color or changes to pink, red, or brown. Any of the following symptoms occur over the next 10 days:  You have a temperature by mouth above 102 F (38.9 C), not controlled by medicine.  Shortness of breath.  Weakness after normal activity.  The white part of the eye turns yellow (jaundice).  You have a decrease in the amount of urine or are urinating less often.  Your urine turns a dark color or changes to pink, red, or brown. Document Released: 11/19/2000 Document Revised: 02/14/2012 Document Reviewed: 07/08/2008 ExitCare Patient Information 2014 [place].  _______________________________________________________________________  Incentive Spirometer  An incentive spirometer is a tool that can help keep your lungs clear and active. This tool measures how well you are filling your lungs with each breath. Taking long deep breaths may help reverse or decrease the chance of developing breathing (pulmonary) problems  (especially infection) following:  A long period of time when you are unable to move or be active. BEFORE THE PROCEDURE   If the spirometer includes an indicator to show your best effort, your nurse or respiratory therapist will set it to a desired goal.  If possible, sit up straight or lean slightly forward. Try not to slouch.  Hold the incentive spirometer in an upright position. INSTRUCTIONS FOR USE  1. Sit on the edge of your bed if possible, or sit up as far as you can in bed or on a chair. 2. Hold the incentive spirometer in an upright position. 3. Breathe out normally. 4. Place the mouthpiece in your mouth and seal your lips tightly around it. 5. Breathe in slowly and as deeply as possible, raising the piston or the ball toward the top of the column. 6. Hold your breath for 3-5 seconds or for as long as possible. Allow the piston or ball to fall to the bottom of the column. 7. Remove the mouthpiece from your mouth and breathe out normally. 8. Rest for a few seconds and repeat Steps 1 through 7 at least 10 times every 1-2 hours when you are awake. Take your time and take a few normal breaths between deep breaths. 9. The spirometer may include an indicator to show your best effort. Use the indicator as a goal to work toward during each repetition. 10. After each set of 10 deep breaths, practice coughing to be  sure your lungs are clear. If you have an incision (the cut made at the time of surgery), support your incision when coughing by placing a pillow or rolled up towels firmly against it. Once you are able to get out of bed, walk around indoors and cough well. You may stop using the incentive spirometer when instructed by your caregiver.  RISKS AND COMPLICATIONS  Take your time so you do not get dizzy or light-headed.  If you are in pain, you may need to take or ask for pain medication before doing incentive spirometry. It is harder to take a deep breath if you are having  pain. AFTER USE  Rest and breathe slowly and easily.  It can be helpful to keep track of a log of your progress. Your caregiver can provide you with a simple table to help with this. If you are using the spirometer at home, follow these instructions: [place] IF:   You are having difficultly using the spirometer.  You have trouble using the spirometer as often as instructed.  Your pain medication is not giving enough relief while using the spirometer.  You develop fever of 100.5 F (38.1 C) or higher. SEEK IMMEDIATE MEDICAL CARE IF:   You cough up bloody sputum that had not been present before.  You develop fever of 102 F (38.9 C) or greater.  You develop worsening pain at or near the incision site. MAKE SURE YOU:   Understand these instructions.  Will watch your condition.  Will get help right away if you are not doing well or get worse. Document Released: 04/04/2007 Document Revised: 02/14/2012 Document Reviewed: 06/05/2007 [facility] Patient Information 2014 [place], [place].   ________________________________________________________________________

## 2018-11-20 NOTE — H&P (Signed)
TOTAL HIP ADMISSION H&P  Patient is admitted for left total hip arthroplasty.  Subjective:  Chief Complaint: left hip pain  HPI: Michelle Lloyd, 52 y.o. female, has a history of pain and functional disability in the left hip(s) due to arthritis and patient has failed non-surgical conservative treatments for greater than 12 weeks to include corticosteriod injections, use of assistive devices and activity modification.  Onset of symptoms was abrupt starting several years ago with rapidly worsening course since that time.The patient noted no past surgery on the left hip(s).  Patient currently rates pain in the left hip at 8 out of 10 with activity. Patient has worsening of pain with activity and weight bearing and crepitus. Patient has evidence of severe bone-on-bone osteoarthritis with an erosion of the femoral head by imaging studies. This condition presents safety issues increasing the risk of falls. There is no current active infection.  Patient Active Problem List   Diagnosis Date Noted  . Cardiac arrest (HCC) 10/08/2017  . ICD (implantable cardioverter-defibrillator) in place 10/08/2017  . S/P repair of coarctation of aorta 10/08/2017  . Turner syndrome 10/08/2017  . HTN (hypertension) 10/06/2017  . 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 NICM (nonischemic cardiomyopathy) (HCC) 10/06/2017  . Aortic valve disorder 01/12/2017  . Ventricular tachycardia (HCC) 01/12/2017  . Syncope 01/11/2017   Past Medical History:  Diagnosis Date  . Allergy   . Anemia   . Bilateral primary osteoarthritis of hip   . Blood transfusion without reported diagnosis   . Cardiac device in situ 07/06/2018   [employer] BIVICD , right chest  . H/O prosthetic aortic valve replacement   . Heart murmur   . History of repair of coarctation of aorta   . Hyperlipidemia   . Non-ischemic cardiomyopathy (HCC)   . Systolic heart failure (HCC)   . Turner syndrome   . Turner's syndrome     Past  Surgical History:  Procedure Laterality Date  . CARDIAC ASSIST DEVICE REMOVAL    . EYE SURGERY    . PACEMAKER INSERTION      No current facility-administered medications for this encounter.    Current Outpatient Medications  Medication Sig Dispense Refill Last Dose  . acetaminophen (TYLENOL) 650 MG CR tablet Take 650 mg by mouth daily.     . Ascorbic Acid (VITAMIN C) 1000 MG tablet Take 1,000 mg by mouth 2 (two) times daily.    Taking  . aspirin EC 81 MG tablet Take 81 mg by mouth daily.     . Cholecalciferol (VITAMIN D3) 125 MCG (5000 UT) TABS Take 5,000 Units by mouth daily.     . Coenzyme Q10 100 MG capsule Take 100 mg by mouth daily.    Taking  . ferrous sulfate 325 (65 FE) MG tablet Take 325 mg by mouth daily with breakfast.   Taking  . furosemide (LASIX) 20 MG tablet Take 20 mg by mouth daily.    Taking  . Garlic 100 MG TABS Take 100 mg by mouth 2 (two) times daily.     Marland Kitchen losartan (COZAAR) 25 MG tablet Take 1 tablet (25 mg total) by mouth daily. 30 tablet 0 Taking  . Magnesium 400 MG TABS Take 400 mg by mouth daily.    Taking  . Menatetrenone (VITAMIN K2) 100 MCG TABS Take 100 mcg by mouth daily.     . metoprolol succinate (TOPROL-XL) 25 MG 24 hr tablet Take 25 mg by mouth daily.   Taking  . omega-3 acid  ethyl esters (LOVAZA) 1 g capsule Take 1 g by mouth daily.    Taking  . Potassium 99 MG TABS Take 99 mg by mouth daily.     . traMADol (ULTRAM) 50 MG tablet Take 50 mg by mouth at bedtime.    Taking   Allergies  Allergen Reactions  . Lisinopril Nausea Only    Social History   Tobacco Use  . Smoking status: Never Smoker  . Smokeless tobacco: Never Used  Substance Use Topics  . Alcohol use: Not Currently    Family History  Problem Relation Age of Onset  . Breast cancer Mother   . Cataracts Mother   . Stroke Father   . Atrial fibrillation Father   . Kidney disease Sister      Review of Systems  Constitutional: Negative for chills and fever.  HENT: Negative for  congestion, sore throat and tinnitus.   Eyes: Negative for double vision, photophobia and pain.  Respiratory: Negative for cough, shortness of breath and wheezing.   Cardiovascular: Negative for chest pain, palpitations and orthopnea.  Gastrointestinal: Negative for heartburn, nausea and vomiting.  Genitourinary: Negative for dysuria, frequency and urgency.  Musculoskeletal: Positive for joint pain.  Neurological: Negative for dizziness, weakness and headaches.  Psychiatric/Behavioral: Hallucinations:  .keh.    Objective:  Physical Exam  Well nourished and well developed.  General: Alert and oriented x3, cooperative and pleasant, no acute distress.  Head: normocephalic, atraumatic, neck supple.  Eyes: EOMI.  Respiratory: breath sounds clear in all fields, no wheezing, rales, or rhonchi. Cardiovascular: Regular rate and rhythm, no murmurs, gallops or rubs.  Abdomen: non-tender to palpation and soft, normoactive bowel sounds. Musculoskeletal: Right Hip Exam: ROM: Flexion to 90, Internal Rotation 0, External Rotation 0, and abduction 10 without discomfort. There is no tenderness over the greater trochanter bursa.  Left Hip Exam: ROM: Flexion to 80, Internal Rotation 0, External Rotation 0, and abduction 0 without discomfort. There is no tenderness over the greater trochanter bursa.  Calves soft and nontender. Motor function intact in LE. Strength 5/5 LE bilaterally. Neuro: Distal pulses 2+. Sensation to light touch intact in LE.   Vital signs in last 24 hours: Blood pressure: 118/78 mmHg Pulse: 80 bpm  Labs:   Estimated body mass index is 20.15 kg/m as calculated from the following:   Height as of 11/01/18: 5' (1.524 m).   Weight as of 11/01/18: 46.8 kg.   Imaging Review Plain radiographs demonstrate severe degenerative joint disease of the left hip(s). The bone quality appears to be adequate for age and reported activity level.    Preoperative templating of the joint  replacement has been completed, documented, and submitted to the Operating Room personnel in order to optimize intra-operative equipment management.     Assessment/Plan:  End stage arthritis, left hip(s)  The patient history, physical examination, clinical judgement of the provider and imaging studies are consistent with end stage degenerative joint disease of the left hip(s) and total hip arthroplasty is deemed medically necessary. The treatment options including medical management, injection therapy, arthroscopy and arthroplasty were discussed at length. The risks and benefits of total hip arthroplasty were presented and reviewed. The risks due to aseptic loosening, infection, stiffness, dislocation/subluxation,  thromboembolic complications and other imponderables were discussed.  The patient acknowledged the explanation, agreed to proceed with the plan and consent was signed. Patient is being admitted for inpatient treatment for surgery, pain control, PT, OT, prophylactic antibiotics, VTE prophylaxis, progressive ambulation and ADL's and discharge planning.The  patient is planning to be discharged home.   Therapy Plans: HEP Disposition: Home with husband Planned DVT Prophylaxis: Xarelto 10 mg daily DME needed: None PCP: Dr. Leretha Pol  Cardiologist: Dr. Duke Salvia TXA: IV Allergies: Lisinopril Anesthesia Concerns: Difficulty awakening BMI: 20.4 Other: Pt has pacemaker.  - Patient was instructed on what medications to stop prior to surgery. - Follow-up visit in 2 weeks with Dr. Lequita Halt - Begin physical therapy following surgery - Pre-operative lab work as pre-surgical testing - Prescriptions will be provided in hospital at time of discharge  Arther Abbott, PA-C Orthopedic Surgery [facility] [place]

## 2018-11-21 ENCOUNTER — Encounter (HOSPITAL_COMMUNITY)
Admission: RE | Admit: 2018-11-21 | Discharge: 2018-11-21 | Disposition: A | Discharge status: Home / Self Care | Payer: Managed Care, Other (non HMO) | Source: Ambulatory Visit | Attending: Orthopedic Surgery | Admitting: Orthopedic Surgery

## 2018-11-21 ENCOUNTER — Encounter (HOSPITAL_COMMUNITY): Payer: Self-pay

## 2018-11-21 ENCOUNTER — Other Ambulatory Visit: Payer: Self-pay

## 2018-11-21 DIAGNOSIS — Z01812 Encounter for preprocedural laboratory examination: Secondary | ICD-10-CM | POA: Diagnosis present

## 2018-11-21 HISTORY — DX: Heart failure, unspecified: I50.9

## 2018-11-21 HISTORY — DX: Other complications of anesthesia, initial encounter: T88.59XA

## 2018-11-21 HISTORY — DX: Presence of cardiac pacemaker: Z95.0

## 2018-11-21 HISTORY — DX: Adverse effect of unspecified anesthetic, initial encounter: T41.45XA

## 2018-11-21 HISTORY — DX: Pneumonia, unspecified organism: J18.9

## 2018-11-21 LAB — SURGICAL PCR SCREEN
MRSA, PCR: NEGATIVE
Staphylococcus aureus: NEGATIVE

## 2018-11-21 LAB — COMPREHENSIVE METABOLIC PANEL
ALT: 14 U/L (ref 0–44)
ANION GAP: 9 (ref 5–15)
AST: 24 U/L (ref 15–41)
Albumin: 4.1 g/dL (ref 3.5–5.0)
Alkaline Phosphatase: 77 U/L (ref 38–126)
BUN: 14 mg/dL (ref 6–20)
CO2: 27 mmol/L (ref 22–32)
Calcium: 9.3 mg/dL (ref 8.9–10.3)
Chloride: 101 mmol/L (ref 98–111)
Creatinine, Ser: 0.61 mg/dL (ref 0.44–1.00)
GFR calc Af Amer: >60 mL/min (ref >60)
GFR calc non Af Amer: >60 mL/min (ref >60)
GLUCOSE: 90 mg/dL (ref 70–99)
Potassium: 4.4 mmol/L (ref 3.5–5.1)
Sodium: 137 mmol/L (ref 135–145)
Total Bilirubin: 0.7 mg/dL (ref 0.3–1.2)
Total Protein: 7.1 g/dL (ref 6.5–8.1)

## 2018-11-21 LAB — CBC
HCT: 37.1 % (ref 36.0–46.0)
Hemoglobin: 11.6 g/dL — ABNORMAL LOW (ref 12.0–15.0)
MCH: 29.9 pg (ref 26.0–34.0)
MCHC: 31.3 g/dL (ref 30.0–36.0)
MCV: 95.6 fL (ref 80.0–100.0)
Platelets: 247 10*3/uL (ref 150–400)
RBC: 3.88 MIL/uL (ref 3.87–5.11)
RDW: 12.5 % (ref 11.5–15.5)
WBC: 6.3 10*3/uL (ref 4.0–10.5)
nRBC: 0 % (ref 0.0–0.2)

## 2018-11-21 LAB — ABO/RH: ABO/RH(D): AB POS

## 2018-11-21 LAB — PROTIME-INR
INR: 0.88
Prothrombin Time: 11.9 seconds (ref 11.4–15.2)

## 2018-11-21 LAB — APTT: aPTT: 29 seconds (ref 24–36)

## 2018-11-21 NOTE — Progress Notes (Addendum)
EP clearance 10-18-18 on chart LOV [facility] EP visit 10-12-18 chart  Milana Na NP  09-01-18 EP study ekg 11-01-18 epic Clearance Chilton Si 10-24-18 on chart

## 2018-11-27 ENCOUNTER — Encounter (HOSPITAL_COMMUNITY)
Admission: RE | Disposition: A | Discharge status: Home / Self Care | Payer: Self-pay | Source: Home / Self Care | Attending: Orthopedic Surgery

## 2018-11-27 ENCOUNTER — Inpatient Hospital Stay (HOSPITAL_COMMUNITY): Payer: Managed Care, Other (non HMO)

## 2018-11-27 ENCOUNTER — Other Ambulatory Visit: Payer: Self-pay

## 2018-11-27 ENCOUNTER — Encounter (HOSPITAL_COMMUNITY): Payer: Self-pay | Admitting: *Deleted

## 2018-11-27 ENCOUNTER — Inpatient Hospital Stay (HOSPITAL_COMMUNITY): Payer: Managed Care, Other (non HMO) | Admitting: Registered Nurse

## 2018-11-27 ENCOUNTER — Inpatient Hospital Stay (HOSPITAL_COMMUNITY)
Admission: RE | Admit: 2018-11-27 | Discharge: 2018-11-28 | DRG: 470 | Disposition: A | Discharge status: Home / Self Care | Payer: Managed Care, Other (non HMO) | Attending: Orthopedic Surgery | Admitting: Orthopedic Surgery

## 2018-11-27 DIAGNOSIS — I10 Essential (primary) hypertension: Secondary | ICD-10-CM | POA: Diagnosis present

## 2018-11-27 DIAGNOSIS — I428 Other cardiomyopathies: Secondary | ICD-10-CM | POA: Diagnosis present

## 2018-11-27 DIAGNOSIS — Z8674 Personal history of sudden cardiac arrest: Secondary | ICD-10-CM | POA: Diagnosis not present

## 2018-11-27 DIAGNOSIS — Z79899 Other long term (current) drug therapy: Secondary | ICD-10-CM | POA: Diagnosis not present

## 2018-11-27 DIAGNOSIS — M1612 Unilateral primary osteoarthritis, left hip: Principal | ICD-10-CM | POA: Diagnosis present

## 2018-11-27 DIAGNOSIS — I5020 Unspecified systolic (congestive) heart failure: Secondary | ICD-10-CM | POA: Diagnosis present

## 2018-11-27 DIAGNOSIS — Z888 Allergy status to other drugs, medicaments and biological substances status: Secondary | ICD-10-CM | POA: Diagnosis not present

## 2018-11-27 DIAGNOSIS — Z95810 Presence of automatic (implantable) cardiac defibrillator: Secondary | ICD-10-CM

## 2018-11-27 DIAGNOSIS — Z419 Encounter for procedure for purposes other than remedying health state, unspecified: Secondary | ICD-10-CM

## 2018-11-27 DIAGNOSIS — M169 Osteoarthritis of hip, unspecified: Secondary | ICD-10-CM | POA: Diagnosis present

## 2018-11-27 DIAGNOSIS — Z96649 Presence of unspecified artificial hip joint: Secondary | ICD-10-CM

## 2018-11-27 DIAGNOSIS — E785 Hyperlipidemia, unspecified: Secondary | ICD-10-CM | POA: Diagnosis present

## 2018-11-27 DIAGNOSIS — Q969 Turner's syndrome, unspecified: Secondary | ICD-10-CM | POA: Diagnosis not present

## 2018-11-27 DIAGNOSIS — Z7982 Long term (current) use of aspirin: Secondary | ICD-10-CM

## 2018-11-27 HISTORY — PX: TOTAL HIP ARTHROPLASTY: SHX124

## 2018-11-27 LAB — TYPE AND SCREEN
ABO/RH(D): AB POS
Antibody Screen: NEGATIVE

## 2018-11-27 SURGERY — ARTHROPLASTY, HIP, TOTAL, ANTERIOR APPROACH
Anesthesia: Spinal | Site: Hip | Laterality: Left

## 2018-11-27 MED ORDER — MIDAZOLAM HCL 2 MG/2ML IJ SOLN: 1.0000 mg | Freq: Once | INTRAMUSCULAR | Status: DC

## 2018-11-27 MED ORDER — DEXAMETHASONE SODIUM PHOSPHATE 10 MG/ML IJ SOLN
10.0000 mg | Freq: Once | INTRAMUSCULAR | Status: AC
  Administered 2018-11-28: 10 mg via INTRAVENOUS
  Filled 2018-11-27: qty 1

## 2018-11-27 MED ORDER — ACETAMINOPHEN 10 MG/ML IV SOLN
1000.0000 mg | Freq: Once | INTRAVENOUS | Status: AC
  Administered 2018-11-27: 1000 mg via INTRAVENOUS
  Filled 2018-11-27: qty 100

## 2018-11-27 MED ORDER — BUPIVACAINE-EPINEPHRINE (PF) 0.25% -1:200000 IJ SOLN
INTRAMUSCULAR | Status: AC
  Filled 2018-11-27: qty 30

## 2018-11-27 MED ORDER — MIDAZOLAM HCL 5 MG/5ML IJ SOLN
INTRAMUSCULAR | Status: DC | PRN
  Administered 2018-11-27: 2 mg via INTRAVENOUS

## 2018-11-27 MED ORDER — OXYCODONE HCL 5 MG/5ML PO SOLN: 5.0000 mg | Freq: Once | ORAL | Status: DC | PRN

## 2018-11-27 MED ORDER — DOCUSATE SODIUM 100 MG PO CAPS
100.0000 mg | ORAL_CAPSULE | Freq: Two times a day (BID) | ORAL | Status: DC
  Administered 2018-11-27 – 2018-11-28 (×2): 100 mg via ORAL
  Filled 2018-11-27 (×2): qty 1

## 2018-11-27 MED ORDER — LACTATED RINGERS IV SOLN
INTRAVENOUS | Status: DC
  Administered 2018-11-27 (×2): via INTRAVENOUS

## 2018-11-27 MED ORDER — FUROSEMIDE 20 MG PO TABS
20.0000 mg | ORAL_TABLET | Freq: Every day | ORAL | Status: DC
  Administered 2018-11-28: 20 mg via ORAL
  Filled 2018-11-27: qty 1

## 2018-11-27 MED ORDER — HYDROCODONE-ACETAMINOPHEN 5-325 MG PO TABS
1.0000 | ORAL_TABLET | ORAL | Status: DC | PRN
  Administered 2018-11-27 – 2018-11-28 (×4): 2 via ORAL
  Filled 2018-11-27 (×4): qty 2

## 2018-11-27 MED ORDER — DEXAMETHASONE SODIUM PHOSPHATE 10 MG/ML IJ SOLN
8.0000 mg | Freq: Once | INTRAMUSCULAR | Status: AC
  Administered 2018-11-27: 10 mg via INTRAVENOUS

## 2018-11-27 MED ORDER — MIDAZOLAM HCL 2 MG/2ML IJ SOLN
INTRAMUSCULAR | Status: AC
  Filled 2018-11-27: qty 2

## 2018-11-27 MED ORDER — ACETAMINOPHEN 500 MG PO TABS
500.0000 mg | ORAL_TABLET | Freq: Four times a day (QID) | ORAL | Status: DC
  Administered 2018-11-27 – 2018-11-28 (×2): 500 mg via ORAL
  Filled 2018-11-27 (×2): qty 1

## 2018-11-27 MED ORDER — BUPIVACAINE IN DEXTROSE 0.75-8.25 % IT SOLN: INTRATHECAL | Status: DC | PRN

## 2018-11-27 MED ORDER — POTASSIUM CHLORIDE CRYS ER 10 MEQ PO TBCR
10.0000 meq | EXTENDED_RELEASE_TABLET | Freq: Every day | ORAL | Status: DC
  Administered 2018-11-28: 10 meq via ORAL
  Filled 2018-11-27: qty 1

## 2018-11-27 MED ORDER — ACETAMINOPHEN 325 MG PO TABS: 325.0000 mg | ORAL_TABLET | ORAL | Status: DC | PRN

## 2018-11-27 MED ORDER — BUPIVACAINE-EPINEPHRINE (PF) 0.25% -1:200000 IJ SOLN
INTRAMUSCULAR | Status: DC | PRN
  Administered 2018-11-27: 30 mL

## 2018-11-27 MED ORDER — POLYETHYLENE GLYCOL 3350 17 G PO PACK: 17.0000 g | PACK | Freq: Every day | ORAL | Status: DC | PRN

## 2018-11-27 MED ORDER — LOSARTAN POTASSIUM 25 MG PO TABS
25.0000 mg | ORAL_TABLET | Freq: Every day | ORAL | Status: DC
  Filled 2018-11-27: qty 1

## 2018-11-27 MED ORDER — OXYCODONE HCL 5 MG PO TABS: 5.0000 mg | ORAL_TABLET | Freq: Once | ORAL | Status: DC | PRN

## 2018-11-27 MED ORDER — METOPROLOL SUCCINATE ER 25 MG PO TB24
25.0000 mg | ORAL_TABLET | Freq: Every day | ORAL | Status: DC
  Administered 2018-11-28: 25 mg via ORAL
  Filled 2018-11-27: qty 1

## 2018-11-27 MED ORDER — FENTANYL CITRATE (PF) 100 MCG/2ML IJ SOLN
INTRAMUSCULAR | Status: AC
  Filled 2018-11-27: qty 2

## 2018-11-27 MED ORDER — FENTANYL CITRATE (PF) 100 MCG/2ML IJ SOLN: 25.0000 ug | INTRAMUSCULAR | Status: DC | PRN

## 2018-11-27 MED ORDER — ACETAMINOPHEN 160 MG/5ML PO SOLN: 325.0000 mg | ORAL | Status: DC | PRN

## 2018-11-27 MED ORDER — STERILE WATER FOR IRRIGATION IR SOLN
Status: DC | PRN
  Administered 2018-11-27: 2000 mL

## 2018-11-27 MED ORDER — PHENYLEPHRINE HCL 10 MG/ML IJ SOLN
INTRAMUSCULAR | Status: AC
  Filled 2018-11-27: qty 2

## 2018-11-27 MED ORDER — RIVAROXABAN 10 MG PO TABS: 10.0000 mg | ORAL_TABLET | Freq: Every day | ORAL | 0 refills | Status: DC

## 2018-11-27 MED ORDER — MORPHINE SULFATE (PF) 2 MG/ML IV SOLN: 0.5000 mg | INTRAVENOUS | Status: DC | PRN

## 2018-11-27 MED ORDER — SODIUM CHLORIDE 0.9 % IV SOLN
INTRAVENOUS | Status: DC
  Administered 2018-11-27: 18:00:00 via INTRAVENOUS

## 2018-11-27 MED ORDER — FENTANYL CITRATE (PF) 100 MCG/2ML IJ SOLN
INTRAMUSCULAR | Status: DC | PRN
  Administered 2018-11-27: 100 ug via INTRAVENOUS

## 2018-11-27 MED ORDER — BISACODYL 10 MG RE SUPP: 10.0000 mg | Freq: Every day | RECTAL | Status: DC | PRN

## 2018-11-27 MED ORDER — DIPHENHYDRAMINE HCL 12.5 MG/5ML PO ELIX: 12.5000 mg | ORAL_SOLUTION | ORAL | Status: DC | PRN

## 2018-11-27 MED ORDER — TRAMADOL HCL 50 MG PO TABS: 50.0000 mg | ORAL_TABLET | Freq: Four times a day (QID) | ORAL | 0 refills | Status: DC | PRN

## 2018-11-27 MED ORDER — RIVAROXABAN 10 MG PO TABS
10.0000 mg | ORAL_TABLET | Freq: Every day | ORAL | Status: DC
  Administered 2018-11-28: 10 mg via ORAL
  Filled 2018-11-27: qty 1

## 2018-11-27 MED ORDER — CEFAZOLIN SODIUM-DEXTROSE 1-4 GM/50ML-% IV SOLN
1.0000 g | Freq: Four times a day (QID) | INTRAVENOUS | Status: AC
  Administered 2018-11-27 – 2018-11-28 (×2): 1 g via INTRAVENOUS
  Filled 2018-11-27 (×2): qty 50

## 2018-11-27 MED ORDER — ONDANSETRON HCL 4 MG/2ML IJ SOLN
INTRAMUSCULAR | Status: DC | PRN
  Administered 2018-11-27: 4 mg via INTRAVENOUS

## 2018-11-27 MED ORDER — ONDANSETRON HCL 4 MG/2ML IJ SOLN: 4.0000 mg | Freq: Once | INTRAMUSCULAR | Status: DC | PRN

## 2018-11-27 MED ORDER — MEPERIDINE HCL 50 MG/ML IJ SOLN: 6.2500 mg | INTRAMUSCULAR | Status: DC | PRN

## 2018-11-27 MED ORDER — FLEET ENEMA 7-19 GM/118ML RE ENEM: 1.0000 | ENEMA | Freq: Once | RECTAL | Status: DC | PRN

## 2018-11-27 MED ORDER — BUPIVACAINE IN DEXTROSE 0.75-8.25 % IT SOLN
INTRATHECAL | Status: DC | PRN
  Administered 2018-11-27: 1.5 mL via INTRATHECAL

## 2018-11-27 MED ORDER — CHLORHEXIDINE GLUCONATE 4 % EX LIQD: 60.0000 mL | Freq: Once | CUTANEOUS | Status: DC

## 2018-11-27 MED ORDER — CEFAZOLIN SODIUM-DEXTROSE 2-4 GM/100ML-% IV SOLN
2.0000 g | INTRAVENOUS | Status: AC
  Administered 2018-11-27: 2 g via INTRAVENOUS
  Filled 2018-11-27: qty 100

## 2018-11-27 MED ORDER — MENTHOL 3 MG MT LOZG: 1.0000 | LOZENGE | OROMUCOSAL | Status: DC | PRN

## 2018-11-27 MED ORDER — METOCLOPRAMIDE HCL 5 MG PO TABS: 5.0000 mg | ORAL_TABLET | Freq: Three times a day (TID) | ORAL | Status: DC | PRN

## 2018-11-27 MED ORDER — METHOCARBAMOL 500 MG IVPB - SIMPLE MED
500.0000 mg | Freq: Four times a day (QID) | INTRAVENOUS | Status: DC | PRN
  Filled 2018-11-27: qty 50

## 2018-11-27 MED ORDER — ONDANSETRON HCL 4 MG PO TABS: 4.0000 mg | ORAL_TABLET | Freq: Four times a day (QID) | ORAL | Status: DC | PRN

## 2018-11-27 MED ORDER — FENTANYL CITRATE (PF) 100 MCG/2ML IJ SOLN
50.0000 ug | Freq: Once | INTRAMUSCULAR | Status: DC
  Filled 2018-11-27: qty 2

## 2018-11-27 MED ORDER — POTASSIUM 99 MG PO TABS: 99.0000 mg | ORAL_TABLET | Freq: Every day | ORAL | Status: DC

## 2018-11-27 MED ORDER — PROPOFOL 10 MG/ML IV BOLUS
INTRAVENOUS | Status: AC
  Filled 2018-11-27: qty 40

## 2018-11-27 MED ORDER — PHENOL 1.4 % MT LIQD
1.0000 | OROMUCOSAL | Status: DC | PRN
  Filled 2018-11-27: qty 177

## 2018-11-27 MED ORDER — ONDANSETRON HCL 4 MG/2ML IJ SOLN: 4.0000 mg | Freq: Four times a day (QID) | INTRAMUSCULAR | Status: DC | PRN

## 2018-11-27 MED ORDER — METOCLOPRAMIDE HCL 5 MG/ML IJ SOLN: 5.0000 mg | Freq: Three times a day (TID) | INTRAMUSCULAR | Status: DC | PRN

## 2018-11-27 MED ORDER — PROPOFOL 10 MG/ML IV BOLUS
INTRAVENOUS | Status: AC
  Filled 2018-11-27: qty 20

## 2018-11-27 MED ORDER — METHOCARBAMOL 500 MG PO TABS
500.0000 mg | ORAL_TABLET | Freq: Four times a day (QID) | ORAL | Status: DC | PRN
  Administered 2018-11-27 – 2018-11-28 (×2): 500 mg via ORAL
  Filled 2018-11-27 (×2): qty 1

## 2018-11-27 MED ORDER — TRAMADOL HCL 50 MG PO TABS: 50.0000 mg | ORAL_TABLET | Freq: Four times a day (QID) | ORAL | Status: DC | PRN

## 2018-11-27 MED ORDER — PROPOFOL 10 MG/ML IV BOLUS
INTRAVENOUS | Status: DC | PRN
  Administered 2018-11-27: 20 mg via INTRAVENOUS

## 2018-11-27 MED ORDER — SODIUM CHLORIDE 0.9 % IV SOLN
INTRAVENOUS | Status: DC | PRN
  Administered 2018-11-27: 50 ug/min via INTRAVENOUS

## 2018-11-27 MED ORDER — 0.9 % SODIUM CHLORIDE (POUR BTL) OPTIME
TOPICAL | Status: DC | PRN
  Administered 2018-11-27: 1000 mL

## 2018-11-27 MED ORDER — METHOCARBAMOL 500 MG PO TABS: 500.0000 mg | ORAL_TABLET | Freq: Four times a day (QID) | ORAL | 0 refills | Status: DC | PRN

## 2018-11-27 MED ORDER — PROPOFOL 500 MG/50ML IV EMUL
INTRAVENOUS | Status: DC | PRN
  Administered 2018-11-27: 50 ug/kg/min via INTRAVENOUS

## 2018-11-27 MED ORDER — HYDROCODONE-ACETAMINOPHEN 5-325 MG PO TABS: 1.0000 | ORAL_TABLET | Freq: Four times a day (QID) | ORAL | 0 refills | Status: DC | PRN

## 2018-11-27 MED ORDER — TRANEXAMIC ACID-NACL 1000-0.7 MG/100ML-% IV SOLN
1000.0000 mg | INTRAVENOUS | Status: AC
  Administered 2018-11-27: 1000 mg via INTRAVENOUS
  Filled 2018-11-27: qty 100

## 2018-11-27 SURGICAL SUPPLY — 42 items
BAG DECANTER FOR FLEXI CONT (MISCELLANEOUS) ×3 IMPLANT
BAG ZIPLOCK 12X15 (MISCELLANEOUS) IMPLANT
BLADE SAG 18X100X1.27 (BLADE) ×3 IMPLANT
BLADE SURG SZ10 CARB STEEL (BLADE) ×6 IMPLANT
CLOSURE WOUND 1/2 X4 (GAUZE/BANDAGES/DRESSINGS) ×1
COVER PERINEAL POST (MISCELLANEOUS) ×3 IMPLANT
COVER SURGICAL LIGHT HANDLE (MISCELLANEOUS) ×3 IMPLANT
COVER WAND RF STERILE (DRAPES) IMPLANT
CUP ACETBLR 48 OD SECTOR II (Hips) ×3 IMPLANT
DECANTER SPIKE VIAL GLASS SM (MISCELLANEOUS) ×3 IMPLANT
DRAPE STERI IOBAN 125X83 (DRAPES) ×3 IMPLANT
DRAPE U-SHAPE 47X51 STRL (DRAPES) ×6 IMPLANT
DRSG ADAPTIC 3X8 NADH LF (GAUZE/BANDAGES/DRESSINGS) ×3 IMPLANT
DRSG MEPILEX BORDER 4X4 (GAUZE/BANDAGES/DRESSINGS) ×3 IMPLANT
DRSG MEPILEX BORDER 4X8 (GAUZE/BANDAGES/DRESSINGS) ×3 IMPLANT
DURAPREP 26ML APPLICATOR (WOUND CARE) ×3 IMPLANT
ELECT REM PT RETURN 15FT ADLT (MISCELLANEOUS) ×3 IMPLANT
EVACUATOR 1/8 PVC DRAIN (DRAIN) ×3 IMPLANT
GLOVE BIO SURGEON STRL SZ7 (GLOVE) ×3 IMPLANT
GLOVE BIO SURGEON STRL SZ8 (GLOVE) ×3 IMPLANT
GLOVE BIOGEL PI IND STRL 7.0 (GLOVE) ×1 IMPLANT
GLOVE BIOGEL PI IND STRL 8 (GLOVE) ×1 IMPLANT
GLOVE BIOGEL PI INDICATOR 7.0 (GLOVE) ×2
GLOVE BIOGEL PI INDICATOR 8 (GLOVE) ×2
GOWN STRL REUS W/TWL LRG LVL3 (GOWN DISPOSABLE) ×3 IMPLANT
GOWN STRL REUS W/TWL XL LVL3 (GOWN DISPOSABLE) ×3 IMPLANT
HEAD CERAMIC DELTA 28M 12/14P5 (Head) ×3 IMPLANT
HOLDER FOLEY CATH W/STRAP (MISCELLANEOUS) ×3 IMPLANT
LINER MARATHON 28 48 (Hips) ×3 IMPLANT
MANIFOLD NEPTUNE II (INSTRUMENTS) ×3 IMPLANT
PACK ANTERIOR HIP CUSTOM (KITS) ×3 IMPLANT
STEM FEM ACTIS STD SZ4 (Stem) ×3 IMPLANT
STRIP CLOSURE SKIN 1/2X4 (GAUZE/BANDAGES/DRESSINGS) ×2 IMPLANT
SUT ETHIBOND NAB CT1 #1 30IN (SUTURE) ×3 IMPLANT
SUT MNCRL AB 4-0 PS2 18 (SUTURE) ×3 IMPLANT
SUT STRATAFIX 0 PDS 27 VIOLET (SUTURE) ×3
SUT VIC AB 2-0 CT1 27 (SUTURE) ×4
SUT VIC AB 2-0 CT1 TAPERPNT 27 (SUTURE) ×2 IMPLANT
SUTURE STRATFX 0 PDS 27 VIOLET (SUTURE) ×1 IMPLANT
SYR 50ML LL SCALE MARK (SYRINGE) IMPLANT
TRAY FOLEY MTR SLVR 16FR STAT (SET/KITS/TRAYS/PACK) ×3 IMPLANT
YANKAUER SUCT BULB TIP 10FT TU (MISCELLANEOUS) ×3 IMPLANT

## 2018-11-27 NOTE — Progress Notes (Signed)
This nurse made contact with [employer] for reprogramming at 1330. No response.   Re-called [employer] at 1445 and 1500.   Call returned at 1403. Dr. Tacy Dura speaking with rep at this time.

## 2018-11-27 NOTE — Anesthesia Preprocedure Evaluation (Addendum)
Anesthesia Evaluation  Patient identified by MRN, date of birth, ID band Patient awake    Reviewed: Allergy & Precautions, H&P , NPO status , Patient's Chart, lab work & pertinent test results, reviewed documented beta blocker date and time   Airway Mallampati: II  TM Distance: >3 FB Neck ROM: full    Dental no notable dental hx.    Pulmonary neg pulmonary ROS,    Pulmonary exam normal breath sounds clear to auscultation       Cardiovascular Exercise Tolerance: Good hypertension, +CHF  + pacemaker  Rhythm:regular Rate:Normal     Neuro/Psych negative neurological ROS  negative psych ROS   GI/Hepatic negative GI ROS, Neg liver ROS,   Endo/Other  negative endocrine ROS  Renal/GU negative Renal ROS  negative genitourinary   Musculoskeletal  (+) Arthritis , Osteoarthritis,    Abdominal   Peds  Hematology  (+) Blood dyscrasia, anemia ,   Anesthesia Other Findings   Reproductive/Obstetrics negative OB ROS                             Anesthesia Physical Anesthesia Plan  ASA: III  Anesthesia Plan: Spinal   Post-op Pain Management:    Induction:   PONV Risk Score and Plan: 3 and 2 and Ondansetron and Treatment may vary due to age or medical condition  Airway Management Planned: Nasal Cannula, Natural Airway and Mask  Additional Equipment:   Intra-op Plan:   Post-operative Plan:   Informed Consent: I have reviewed the patients History and Physical, chart, labs and discussed the procedure including the risks, benefits and alternatives for the proposed anesthesia with the patient or authorized representative who has indicated his/her understanding and acceptance.   Dental Advisory Given  Plan Discussed with: CRNA, Anesthesiologist and Surgeon  Anesthesia Plan Comments:        Anesthesia Quick Evaluation

## 2018-11-27 NOTE — Op Note (Signed)
OPERATIVE REPORT- TOTAL HIP ARTHROPLASTY   PREOPERATIVE DIAGNOSIS: Osteoarthritis of the Left hip.   POSTOPERATIVE DIAGNOSIS: Osteoarthritis of the Left  hip.   PROCEDURE: Left total hip arthroplasty, anterior approach.   SURGEON: Ollen Gross, MD   ASSISTANT: Dimitri Ped, PA-C  ANESTHESIA:  Spinal  ESTIMATED BLOOD LOSS:-350 mL    DRAINS: Hemovac x1.   COMPLICATIONS: None   CONDITION: PACU - hemodynamically stable.   BRIEF CLINICAL NOTE: Michelle Lloyd is a 52 y.o. female who has advanced end-  stage arthritis of their Left  hip with progressively worsening pain and  dysfunction.The patient has failed nonoperative management and presents for  total hip arthroplasty.   PROCEDURE IN DETAIL: After successful administration of spinal  anesthetic, the traction boots for the [facility] bed were placed on both  feet and the patient was placed onto the [facility] bed, boots placed into the leg  holders. The Left hip was then isolated from the perineum with plastic  drapes and prepped and draped in the usual sterile fashion. ASIS and  greater trochanter were marked and a oblique incision was made, starting  at about 1 cm lateral and 2 cm distal to the ASIS and coursing towards  the anterior cortex of the femur. The skin was cut with a 10 blade  through subcutaneous tissue to the level of the fascia overlying the  tensor fascia lata muscle. The fascia was then incised in line with the  incision at the junction of the anterior third and posterior 2/3rd. The  muscle was teased off the fascia and then the interval between the TFL  and the rectus was developed. The Hohmann retractor was then placed at  the top of the femoral neck over the capsule. The vessels overlying the  capsule were cauterized and the fat on top of the capsule was removed.  A Hohmann retractor was then placed anterior underneath the rectus  femoris to give exposure to the entire anterior capsule. A T-shaped   capsulotomy was performed. The edges were tagged and the femoral head  was identified.       Osteophytes are removed off the superior acetabulum.  The femoral neck was then cut in situ with an oscillating saw. Traction  was then applied to the left lower extremity utilizing the [facility]  traction. The femoral head was then removed. Retractors were placed  around the acetabulum and then circumferential removal of the labrum was  performed. Osteophytes were also removed. Reaming starts at 45 mm to  medialize and  Increased in 2 mm increments to 47 mm. We reamed in  approximately 40 degrees of abduction, 20 degrees anteversion. A 48 mm  pinnacle acetabular shell was then impacted in anatomic position under  fluoroscopic guidance with excellent purchase. We did not need to place  any additional dome screws. A 28 mm neutral + 4 marathon liner was then  placed into the acetabular shell.       The femoral lift was then placed along the lateral aspect of the femur  just distal to the vastus ridge. The leg was  externally rotated and capsule  was stripped off the inferior aspect of the femoral neck down to the  level of the lesser trochanter, this was done with electrocautery. The femur was lifted after this was performed. The  leg was then placed in an extended and adducted position essentially delivering the femur. We also removed the capsule superiorly and the piriformis from the piriformis  fossa to gain excellent exposure of the  proximal femur. Rongeur was used to remove some cancellous bone to get  into the lateral portion of the proximal femur for placement of the  initial starter reamer. The starter broaches was placed  the starter broach  and was shown to go down the center of the canal. Broaching  with the Actis system was then performed starting at size 0  coursing  Up to size 4. A size 4 had excellent torsional and rotational  and axial stability. The trial standard offset neck was then  placed  with a 28 + 5 trial head. The hip was then reduced. We confirmed that  the stem was in the canal both on AP and lateral x-rays. It also has excellent sizing. The hip was reduced with outstanding stability through full extension and full external rotation.. AP pelvis was taken and the leg lengths were measured and found to be equal. Hip was then dislocated again and the femoral head and neck removed. The  femoral broach was removed. Size 4 Actis stem with a standard offset  neck was then impacted into the femur following native anteversion. Has  excellent purchase in the canal. Excellent torsional and rotational and  axial stability. It is confirmed to be in the canal on AP and lateral  fluoroscopic views. The 28 + 5 ceramic head was placed and the hip  reduced with outstanding stability. Again AP pelvis was taken and it  confirmed that the leg lengths were equal. The wound was then copiously  irrigated with saline solution and the capsule reattached and repaired  with Ethibond suture. 30 ml of .25% Bupivicaine was  injected into the capsule and into the edge of the tensor fascia lata as well as subcutaneous tissue. The fascia overlying the tensor fascia lata was then closed with a running #1 V-Loc. Subcu was closed with interrupted 2-0 Vicryl and subcuticular running 4-0 Monocryl. Incision was cleaned  and dried. Steri-Strips and a bulky sterile dressing applied. Hemovac  drain was hooked to suction and then the patient was awakened and transported to  recovery in stable condition.        Please note that a surgical assistant was a medical necessity for this procedure to perform it in a safe and expeditious manner. Assistant was necessary to provide appropriate retraction of vital neurovascular structures and to prevent femoral fracture and allow for anatomic placement of the prosthesis.  Ollen GrossFrank Ramah Langhans, M.D.

## 2018-11-27 NOTE — Transfer of Care (Signed)
Immediate Anesthesia Transfer of Care Note  Patient: Tanaisha Pittman  Procedure(s) Performed: TOTAL HIP ARTHROPLASTY ANTERIOR APPROACH (Left Hip)  Patient Location: PACU  Anesthesia Type:MAC and Spinal  Level of Consciousness: awake, alert , oriented and patient cooperative  Airway & Oxygen Therapy: Patient Spontanous Breathing and Patient connected to face mask oxygen  Post-op Assessment: Report given to RN, Post -op Vital signs reviewed and stable and Patient moving all extremities  Post vital signs: Reviewed and stable  Last Vitals:  Vitals Value Taken Time  BP 101/63 11/27/2018  5:15 PM  Temp    Pulse 70 11/27/2018  5:26 PM  Resp 13 11/27/2018  5:26 PM  SpO2 100 % 11/27/2018  5:26 PM  Vitals shown include unvalidated device data.  Last Pain:  Vitals:   11/27/18 1715  TempSrc:   PainSc: 0-No pain         Complications: No apparent anesthesia complications

## 2018-11-27 NOTE — Progress Notes (Signed)
Spoke with Dr. Tacy Dura about ICD orders. In chart paper copy states reprogramming or use of magnet. There are no further comments in notes in chart on plans for ICD to be reprogrammed for surgery. Dr. Tacy Dura made aware and gives no other orders at this time. Proceeding with procedure at this time.

## 2018-11-27 NOTE — Discharge Instructions (Addendum)
°Dr. Frank Aluisio °Total Joint Specialist °[facility] °[address] °[place], [place] [address] °[phone] ° °ANTERIOR APPROACH TOTAL HIP REPLACEMENT POSTOPERATIVE DIRECTIONS ° ° °Hip Rehabilitation, Guidelines Following Surgery  °The results of a hip operation are greatly improved after range of motion and muscle strengthening exercises. Follow all safety measures which are given to protect your hip. If any of these exercises cause increased pain or swelling in your joint, decrease the amount until you are comfortable again. Then slowly increase the exercises. Call your caregiver if you have problems or questions.  ° °HOME CARE INSTRUCTIONS  °• Remove items at home which could result in a fall. This includes throw rugs or furniture in walking pathways.  °· ICE to the affected hip every three hours for 30 minutes at a time and then as needed for pain and swelling.  Continue to use ice on the hip for pain and swelling from surgery. You may notice swelling that will progress down to the foot and ankle.  This is normal after surgery.  Elevate the leg when you are not up walking on it.   °· Continue to use the breathing machine which will help keep your temperature down.  It is common for your temperature to cycle up and down following surgery, especially at night when you are not up moving around and exerting yourself.  The breathing machine keeps your lungs expanded and your temperature down. ° °DIET °You may resume your previous home diet once your are discharged from the hospital. ° °DRESSING / WOUND CARE / SHOWERING °You may shower 3 days after surgery, but keep the wounds dry during showering.  You may use an occlusive plastic wrap (Press'n Seal for example), NO SOAKING/SUBMERGING IN THE BATHTUB.  If the bandage gets wet, change with a clean dry gauze.  If the incision gets wet, pat the wound dry with a clean towel. °You may start showering once you are discharged home but do not submerge the  incision under water. Just pat the incision dry and apply a dry gauze dressing on daily. °Change the surgical dressing daily and reapply a dry dressing each time. ° °ACTIVITY °Walk with your walker as instructed. °Use walker as long as suggested by your caregivers. °Avoid periods of inactivity such as sitting longer than an hour when not asleep. This helps prevent blood clots.  °You may resume a sexual relationship in one month or when given the OK by your doctor.  °You may return to work once you are cleared by your doctor.  °Do not drive a car for 6 weeks or until released by you surgeon.  °Do not drive while taking narcotics. ° °WEIGHT BEARING °Weight bearing as tolerated with assist device (walker, cane, etc) as directed, use it as long as suggested by your surgeon or therapist, typically at least 4-6 weeks. ° °POSTOPERATIVE CONSTIPATION PROTOCOL °Constipation - defined medically as fewer than three stools per week and severe constipation as less than one stool per week. ° °One of the most common issues patients have following surgery is constipation.  Even if you have a regular bowel pattern at home, your normal regimen is likely to be disrupted due to multiple reasons following surgery.  Combination of anesthesia, postoperative narcotics, change in appetite and fluid intake all can affect your bowels.  In order to avoid complications following surgery, here are some recommendations in order to help you during your recovery period. ° °Colace (docusate) - Pick up an over-the-counter form   of Colace or another stool softener and take twice a day as long as you are requiring postoperative pain medications.  Take with a full glass of water daily.  If you experience loose stools or diarrhea, hold the colace until you stool forms back up.  If your symptoms do not get better within 1 week or if they get worse, check with your doctor. ° °Dulcolax (bisacodyl) - Pick up over-the-counter and take as directed by the product  packaging as needed to assist with the movement of your bowels.  Take with a full glass of water.  Use this product as needed if not relieved by Colace only.  ° °MiraLax (polyethylene glycol) - Pick up over-the-counter to have on hand.  MiraLax is a solution that will increase the amount of water in your bowels to assist with bowel movements.  Take as directed and can mix with a glass of water, juice, soda, coffee, or tea.  Take if you go more than two days without a movement. °Do not use MiraLax more than once per day. Call your doctor if you are still constipated or irregular after using this medication for 7 days in a row. ° °If you continue to have problems with postoperative constipation, please contact the office for further assistance and recommendations.  If you experience "the worst abdominal pain ever" or develop nausea or vomiting, please contact the office immediatly for further recommendations for treatment. ° °ITCHING ° If you experience itching with your medications, try taking only a single pain pill, or even half a pain pill at a time.  You can also use Benadryl over the counter for itching or also to help with sleep.  ° °TED HOSE STOCKINGS °Wear the elastic stockings on both legs for three weeks following surgery during the day but you may remove then at night for sleeping. ° °MEDICATIONS °See your medication summary on the “After Visit Summary” that the nursing staff will review with you prior to discharge.  You may have some home medications which will be placed on hold until you complete the course of blood thinner medication.  It is important for you to complete the blood thinner medication as prescribed by your surgeon.  Continue your approved medications as instructed at time of discharge. ° °PRECAUTIONS °If you experience chest pain or shortness of breath - call 911 immediately for transfer to the hospital emergency department.  °If you develop a fever greater that 101 F, purulent drainage  from wound, increased redness or drainage from wound, foul odor from the wound/dressing, or calf pain - CONTACT YOUR SURGEON.   °                                                °FOLLOW-UP APPOINTMENTS °Make sure you keep all of your appointments after your operation with your surgeon and caregivers. You should call the office at the above phone number and make an appointment for approximately two weeks after the date of your surgery or on the date instructed by your surgeon outlined in the "After Visit Summary". ° °RANGE OF MOTION AND STRENGTHENING EXERCISES  °These exercises are designed to help you keep full movement of your hip joint. Follow your caregiver's or physical therapist's instructions. Perform all exercises about fifteen times, three times per day or as directed. Exercise both hips, even if you have   had only one joint replacement. These exercises can be done on a training (exercise) mat, on the floor, on a table or on a bed. Use whatever works the best and is most comfortable for you. Use music or television while you are exercising so that the exercises are a pleasant break in your day. This will make your life better with the exercises acting as a break in routine you can look forward to.  °• Lying on your back, slowly slide your foot toward your buttocks, raising your knee up off the floor. Then slowly slide your foot back down until your leg is straight again.  °• Lying on your back spread your legs as far apart as you can without causing discomfort.  °• Lying on your side, raise your upper leg and foot straight up from the floor as far as is comfortable. Slowly lower the leg and repeat.  °• Lying on your back, tighten up the muscle in the front of your thigh (quadriceps muscles). You can do this by keeping your leg straight and trying to raise your heel off the floor. This helps strengthen the largest muscle supporting your knee.  °• Lying on your back, tighten up the muscles of your buttocks both  with the legs straight and with the knee bent at a comfortable angle while keeping your heel on the floor.  ° °IF YOU ARE TRANSFERRED TO A SKILLED REHAB FACILITY °If the patient is transferred to a skilled rehab facility following release from the hospital, a list of the current medications will be sent to the facility for the patient to continue.  When discharged from the skilled rehab facility, please have the facility set up the patient's Home Health Physical Therapy prior to being released. Also, the skilled facility will be responsible for providing the patient with their medications at time of release from the facility to include their pain medication, the muscle relaxants, and their blood thinner medication. If the patient is still at the rehab facility at time of the two week follow up appointment, the skilled rehab facility will also need to assist the patient in arranging follow up appointment in our office and any transportation needs. ° °MAKE SURE YOU:  °• Understand these instructions.  °• Get help right away if you are not doing well or get worse.  ° ° °Pick up stool softner and laxative for home use following surgery while on pain medications. °Do not submerge incision under water. °Please use good hand washing techniques while changing dressing each day. °May shower starting three days after surgery. °Please use a clean towel to pat the incision dry following showers. °Continue to use ice for pain and swelling after surgery. °Do not use any lotions or creams on the incision until instructed by your surgeon. ° °Information on my medicine - XARELTO® (Rivaroxaban) ° °Why was Xarelto® prescribed for you? °Xarelto® was prescribed for you to reduce the risk of blood clots forming after orthopedic surgery. The medical term for these abnormal blood clots is venous thromboembolism (VTE). ° °What do you need to know about xarelto® ? °Take your Xarelto® ONCE DAILY at the same time every day. °You may take it  either with or without food. ° °If you have difficulty swallowing the tablet whole, you may crush it and mix in applesauce just prior to taking your dose. ° °Take Xarelto® exactly as prescribed by your doctor and DO NOT stop taking Xarelto® without talking to the doctor who prescribed the medication.    Stopping without other VTE prevention medication to take the place of Xarelto® may increase your risk of developing a clot. ° °After discharge, you should have regular check-up appointments with your healthcare provider that is prescribing your Xarelto®.   ° °What do you do if you miss a dose? °If you miss a dose, take it as soon as you remember on the same day then continue your regularly scheduled once daily regimen the next day. Do not take two doses of Xarelto® on the same day.  ° °Important Safety Information °A possible side effect of Xarelto® is bleeding. You should call your healthcare provider right away if you experience any of the following: °? Bleeding from an injury or your nose that does not stop. °? Unusual colored urine (red or dark brown) or unusual colored stools (red or black). °? Unusual bruising for unknown reasons. °? A serious fall or if you hit your head (even if there is no bleeding). ° °Some medicines may interact with Xarelto® and might increase your risk of bleeding while on Xarelto®. To help avoid this, consult your healthcare provider or pharmacist prior to using any new prescription or non-prescription medications, including herbals, vitamins, non-steroidal anti-inflammatory drugs (NSAIDs) and supplements. ° °This website has more information on Xarelto®: www.xarelto.com. ° ° ° °

## 2018-11-27 NOTE — Interval H&P Note (Signed)
History and Physical Interval Note:  11/27/2018 12:45 PM  Michelle Lloyd  has presented today for surgery, with the diagnosis of left hip osteoarthritis  The various methods of treatment have been discussed with the patient and family. After consideration of risks, benefits and other options for treatment, the patient has consented to  Procedure(s) with comments: TOTAL HIP ARTHROPLASTY ANTERIOR APPROACH (Left) - as a surgical intervention .  The patient's history has been reviewed, patient examined, no change in status, stable for surgery.  I have reviewed the patient's chart and labs.  Questions were answered to the patient's satisfaction.     Homero Fellers Kinesha Auten

## 2018-11-27 NOTE — Anesthesia Procedure Notes (Signed)
Spinal  Patient location during procedure: OR Start time: 11/27/2018 3:05 PM End time: 11/27/2018 3:11 PM Staffing Anesthesiologist: Bethena Midget, MD Preanesthetic Checklist Completed: patient identified, site marked, surgical consent, pre-op evaluation, timeout performed, IV checked, risks and benefits discussed and monitors and equipment checked Spinal Block Patient position: sitting Prep: DuraPrep Patient monitoring: heart rate, cardiac monitor, continuous pulse ox and blood pressure Approach: midline Location: L3-4 Injection technique: single-shot Needle Needle type: Sprotte  Needle gauge: 24 G Needle length: 9 cm Assessment Sensory level: T4

## 2018-11-28 LAB — BASIC METABOLIC PANEL
Anion gap: 11 (ref 5–15)
BUN: 18 mg/dL (ref 6–20)
CALCIUM: 8.4 mg/dL — AB (ref 8.9–10.3)
CO2: 23 mmol/L (ref 22–32)
Chloride: 105 mmol/L (ref 98–111)
Creatinine, Ser: 0.65 mg/dL (ref 0.44–1.00)
GFR calc Af Amer: >60 mL/min (ref >60)
GFR calc non Af Amer: >60 mL/min (ref >60)
Glucose, Bld: 129 mg/dL — ABNORMAL HIGH (ref 70–99)
Potassium: 4.5 mmol/L (ref 3.5–5.1)
Sodium: 139 mmol/L (ref 135–145)

## 2018-11-28 LAB — CBC
HCT: 28.9 % — ABNORMAL LOW (ref 36.0–46.0)
Hemoglobin: 9 g/dL — ABNORMAL LOW (ref 12.0–15.0)
MCH: 30.5 pg (ref 26.0–34.0)
MCHC: 31.1 g/dL (ref 30.0–36.0)
MCV: 98 fL (ref 80.0–100.0)
NRBC: 0 % (ref 0.0–0.2)
Platelets: 196 10*3/uL (ref 150–400)
RBC: 2.95 MIL/uL — ABNORMAL LOW (ref 3.87–5.11)
RDW: 12.3 % (ref 11.5–15.5)
WBC: 14.7 10*3/uL — ABNORMAL HIGH (ref 4.0–10.5)

## 2018-11-28 NOTE — Progress Notes (Signed)
   Subjective: 1 Day Post-Op Procedure(s) (LRB): TOTAL HIP ARTHROPLASTY ANTERIOR APPROACH (Left) Patient reports pain as mild.   We will start therapy today.  Plan is to go Home after hospital stay.  Objective: Vital signs in last 24 hours: Temp:  [97.4 F (36.3 C)-98.9 F (37.2 C)] 98.6 F (37 C) (12/24 0525) Pulse Rate:  [71-90] 80 (12/24 0525) Resp:  [13-17] 17 (12/24 0525) BP: (92-120)/(44-67) 117/62 (12/24 0525) SpO2:  [100 %] 100 % (12/24 0525) Weight:  [46.3 kg] 46.3 kg (12/23 1758)  Intake/Output from previous day:  Intake/Output Summary (Last 24 hours) at 11/28/2018 0722 Last data filed at 11/28/2018 2956 Gross per 24 hour  Intake 2476.4 ml  Output 1420 ml  Net 1056.4 ml    Intake/Output this shift: No intake/output data recorded.  Labs: Recent Labs    11/28/18 0532  HGB 9.0*   Recent Labs    11/28/18 0532  WBC 14.7*  RBC 2.95*  HCT 28.9*  PLT 196   Recent Labs    11/28/18 0532  NA 139  K 4.5  CL 105  CO2 23  BUN 18  CREATININE 0.65  GLUCOSE 129*  CALCIUM 8.4*   No results for input(s): LABPT, INR in the last 72 hours.  EXAM General - Patient is Alert, Appropriate and Oriented Extremity - Neurologically intact Neurovascular intact No cellulitis present Compartment soft Dressing - dressing C/D/I Motor Function - intact, moving foot and toes well on exam.  Hemovac pulled without difficulty.  Past Medical History:  Diagnosis Date  . Allergy   . Anemia   . Bilateral primary osteoarthritis of hip   . Blood transfusion without reported diagnosis   . Cardiac device in situ 07/06/2018   Biotronik BIVICD , right chest  . CHF (congestive heart failure) (HCC)   . Complication of anesthesia    hard to wake up at times in past  . H/O prosthetic aortic valve replacement   . Heart murmur   . History of repair of coarctation of aorta   . Hyperlipidemia   . Non-ischemic cardiomyopathy (HCC)   . Pneumonia   . 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 Systolic heart failure (HCC)   . Turner syndrome   . Turner's syndrome     Assessment/Plan: 1 Day Post-Op Procedure(s) (LRB): TOTAL HIP ARTHROPLASTY ANTERIOR APPROACH (Left) Principal Problem:   OA (osteoarthritis) of hip   Advance diet Up with therapy D/C IV fluids  Probable discharge home this afternoon  DVT Prophylaxis - Aspirin Weight Bearing As Tolerated left Leg Hemovac Pulled Begin Therapy  Michelle Lloyd

## 2018-11-28 NOTE — Care Management Note (Signed)
Case Management Note  Patient Details  Name: Michelle Lloyd MRN: [id number] Date of Birth: [birth year]  Subjective/Objective:                    Action/Plan:Pt discharging with HEP   Expected Discharge Date:  11/28/18               Expected Discharge Plan:  OP Rehab  In-House Referral:     Discharge planning Services  CM Consult  Post Acute Care Choice:    Choice offered to:  Patient  DME Arranged:    DME Agency:     HH Arranged:    HH Agency:     Status of Service:  Completed, signed off  If discussed at [employer] of Stay Meetings, dates discussed:    Additional CommentsGeni Bers, RN 11/28/2018, 12:05 PM

## 2018-11-28 NOTE — Plan of Care (Signed)
Pt is stable. Pain management in progress, effective.  

## 2018-11-28 NOTE — Progress Notes (Signed)
Physical Therapy Treatment Patient Details Name: Michelle DawesCassandra Lee Lloyd MRN: [id number] DOB: Jul 07, 1966 Today's Date: 11/28/2018    History of Present Illness 52 yo female s/p L THA-direct anterior 11/27/18.     PT Comments    Progressing with mobility. Reviewed/practiced exercises, gait training, and stair training. Issued HEP for pt to perform 2x/day. All education completed. Okay to d/c from PT standpoint-made RN aware.    Follow Up Recommendations  Follow surgeon's recommendation for DC plan and follow-up therapies     Equipment Recommendations  None recommended by PT    Recommendations for Other Services       Precautions / Restrictions Precautions Precautions: Fall Restrictions Weight Bearing Restrictions: No Other Position/Activity Restrictions: WBAT    Mobility  Bed Mobility               General bed mobility comments: sitting EOB at start of session  Transfers Overall transfer level: Needs assistance Equipment used: Rolling walker (2 wheeled) Transfers: Sit to/from Stand Sit to Stand: Min guard         General transfer comment: Close guard for safety. VCS safety, hand placement, posture  Ambulation/Gait Ambulation/Gait assistance: Min guard Gait Distance (Feet): 75 Feet Assistive device: Rolling walker (2 wheeled) Gait Pattern/deviations: Step-through pattern;Decreased stride length     General Gait Details: Cues for safety, posture, distance from RW. Close guard for safety.    Stairs Stairs: Yes Stairs assistance: Min assist Stair Management: Two rails;One rail Left;Step to pattern;Forwards Number of Stairs: 5 General stair comments: up and over portable steps x 2. VC safety, technique, sequence. Assist to stabilize. Discussed possibly using cane +1 rail where she only has one rail available for use.    Wheelchair Mobility    Modified Rankin (Stroke Patients Only)       Balance Overall balance assessment: Mild deficits observed,  not formally tested                                          Cognition Arousal/Alertness: Awake/alert Behavior During Therapy: WFL for tasks assessed/performed Overall Cognitive Status: Within Functional Limits for tasks assessed                                        Exercises Total Joint Exercises Ankle Circles/Pumps: AROM;Both;10 reps;Seated Quad Sets: AROM;Both;10 reps;Seated Heel Slides: AAROM;Left;10 reps;Seated Hip ABduction/ADduction: AAROM;Left;10 reps;Standing Knee Flexion: AROM;Left;10 reps;Standing Marching in Standing: AROM;Right;10 reps;Standing General Exercises - Lower Extremity Heel Raises: AROM;Both;10 reps;Standing    General Comments        Pertinent Vitals/Pain Pain Assessment: 0-10 Pain Score: 7  Pain Location: L hip/thigh Pain Descriptors / Indicators: Aching;Sore Pain Intervention(s): Monitored during session;Repositioned    Home Living                      Prior Function            PT Goals (current goals can now be found in the care plan section) Acute Rehab PT Goals Patient Stated Goal: less pain. regain independence PT Goal Formulation: With patient Time For Goal Achievement: 12/12/18 Potential to Achieve Goals: Good Progress towards PT goals: Progressing toward goals    Frequency    7X/week      PT Plan Current plan remains appropriate  Co-evaluation              AM-PAC PT "6 Clicks" Mobility   Outcome Measure  Help needed turning from your back to your side while in a flat bed without using bedrails?: A Little Help needed moving from lying on your back to sitting on the side of a flat bed without using bedrails?: A Little Help needed moving to and from a bed to a chair (including a wheelchair)?: A Little Help needed standing up from a chair using your arms (e.g., wheelchair or bedside chair)?: A Little Help needed to walk in hospital room?: A Little Help needed climbing  3-5 steps with a railing? : A Little 6 Click Score: 18    End of Session Equipment Utilized During Treatment: Gait belt Activity Tolerance: Patient tolerated treatment well Patient left: in chair;with call bell/phone within reach   PT Visit Diagnosis: Pain;Unsteadiness on feet (R26.81);Other abnormalities of gait and mobility (R26.89);Muscle weakness (generalized) (M62.81) Pain - Right/Left: Left Pain - part of body: Hip     Time: 1349-1405 PT Time Calculation (min) (ACUTE ONLY): 16 min  Charges:  $Gait Training: 8-22 mins                        Rebeca Alert, PT Acute Rehabilitation Services Pager: [phone] Office: [phone]

## 2018-11-28 NOTE — Progress Notes (Signed)
At 1120, d/c instructions and prescriptions were provided to the pt. After discussing the pt's plan of care upon d/c home, the pt reported no further questions or concerns. The pt has one more sessions of PT prior to d/c; pt is aware.

## 2018-11-28 NOTE — Evaluation (Signed)
Physical Therapy Evaluation Patient Details Name: Michelle Lloyd MRN: [id number] DOB: March 19, 1966 Today's Date: 11/28/2018   History of Present Illness  52 yo female s/p L THA-direct anterior 11/27/18.   Clinical Impression  On eval, pt required Min guard-Min assist for mobility. She walked ~100 feet with a RW. Moderate pain with activity. Will plan to have a 2nd session prior to d/c home later today. Plan is for home with a HEP.     Follow Up Recommendations Follow surgeon's recommendation for DC plan and follow-up therapies    Equipment Recommendations  None recommended by PT    Recommendations for Other Services       Precautions / Restrictions Precautions Precautions: Fall Restrictions Weight Bearing Restrictions: No Other Position/Activity Restrictions: WBAT      Mobility  Bed Mobility               General bed mobility comments: sitting EOB at start of session  Transfers Overall transfer level: Needs assistance Equipment used: Rolling walker (2 wheeled) Transfers: Sit to/from Stand Sit to Stand: Min guard         General transfer comment: Close guard for safety. VCS safety, hand placement, posture  Ambulation/Gait Ambulation/Gait assistance: Min assist;Min guard Gait Distance (Feet): 100 Feet Assistive device: Rolling walker (2 wheeled) Gait Pattern/deviations: Step-to pattern;Step-through pattern;Decreased stride length     General Gait Details: Cues for safety, posture, distance from RW. Intermittent assist to steady. Slow gait speed.   Stairs            Wheelchair Mobility    Modified Rankin (Stroke Patients Only)       Balance Overall balance assessment: Mild deficits observed, not formally tested                                           Pertinent Vitals/Pain Pain Assessment: 0-10 Pain Score: 7  Pain Location: L hip/thigh Pain Descriptors / Indicators: Aching;Sore Pain Intervention(s): Monitored  during session;Repositioned;Ice applied    Home Living Family/patient expects to be discharged to:: Private residence Living Arrangements: Spouse/significant other;Children Available Help at Discharge: Family Type of Home: House Home Access: Level entry     Home Layout: Bed/bath upstairs Home Equipment: Gilmer Mor - single point;Walker - 2 wheels      Prior Function Level of Independence: Independent               Hand Dominance        Extremity/Trunk Assessment   Upper Extremity Assessment Upper Extremity Assessment: Generalized weakness    Lower Extremity Assessment Lower Extremity Assessment: Generalized weakness(leg length discrepancy present)    Cervical / Trunk Assessment Cervical / Trunk Assessment: Kyphotic  Communication   Communication: No difficulties  Cognition Arousal/Alertness: Awake/alert Behavior During Therapy: WFL for tasks assessed/performed Overall Cognitive Status: Within Functional Limits for tasks assessed                                        General Comments      Exercises Total Joint Exercises Ankle Circles/Pumps: AROM;Both;10 reps;Seated Quad Sets: AROM;Both;10 reps;Seated Heel Slides: AAROM;Left;10 reps;Seated Hip ABduction/ADduction: AAROM;Left;10 reps;Seated   Assessment/Plan    PT Assessment Patient needs continued PT services  PT Problem List Decreased strength;Decreased balance;Decreased mobility;Decreased range of motion;Decreased activity tolerance;Pain;Decreased knowledge of use of  DME       PT Treatment Interventions DME instruction;Gait training;Functional mobility training;Balance training;Patient/family education;Therapeutic activities;Stair training;Therapeutic exercise    PT Goals (Current goals can be found in the Care Plan section)  Acute Rehab PT Goals Patient Stated Goal: less pain. regain independence PT Goal Formulation: With patient Time For Goal Achievement: 12/12/18 Potential to  Achieve Goals: Good    Frequency 7X/week   Barriers to discharge        Co-evaluation               AM-PAC PT "6 Clicks" Mobility  Outcome Measure Help needed turning from your back to your side while in a flat bed without using bedrails?: A Little Help needed moving from lying on your back to sitting on the side of a flat bed without using bedrails?: A Little Help needed moving to and from a bed to a chair (including a wheelchair)?: A Little Help needed standing up from a chair using your arms (e.g., wheelchair or bedside chair)?: A Little Help needed to walk in hospital room?: A Little Help needed climbing 3-5 steps with a railing? : A Little 6 Click Score: 18    End of Session Equipment Utilized During Treatment: Gait belt Activity Tolerance: Patient tolerated treatment well Patient left: in chair;with call bell/phone within reach   PT Visit Diagnosis: Pain;Unsteadiness on feet (R26.81);Other abnormalities of gait and mobility (R26.89);Muscle weakness (generalized) (M62.81) Pain - Right/Left: Left Pain - part of body: Hip    Time: [id number] PT Time Calculation (min) (ACUTE ONLY): 21 min   Charges:   PT Evaluation $PT Eval Low Complexity: 1 Low            Rebeca AlertJannie Shayon Trompeter, PT Acute Rehabilitation Services Pager: [phone] Office: [phone]

## 2018-11-29 NOTE — Anesthesia Postprocedure Evaluation (Signed)
Anesthesia Post Note  Patient: Michelle Lloyd  Procedure(s) Performed: TOTAL HIP ARTHROPLASTY ANTERIOR APPROACH (Left Hip)     Patient location during evaluation: PACU Anesthesia Type: Spinal Level of consciousness: oriented and awake and alert Pain management: pain level controlled Vital Signs Assessment: post-procedure vital signs reviewed and stable Respiratory status: spontaneous breathing, respiratory function stable and patient connected to nasal cannula oxygen Cardiovascular status: blood pressure returned to baseline and stable Postop Assessment: no headache, no backache and no apparent nausea or vomiting Anesthetic complications: no    Last Vitals:  Vitals:   11/28/18 0757 11/28/18 1053  BP: (!) 108/43 (!) 111/58  Pulse: 84 80  Resp:  16  Temp:  36.7 C  SpO2: 98% 100%    Last Pain:  Vitals:   11/28/18 1426  TempSrc:   PainSc: (P) 6                  Yussuf Sawyers

## 2018-11-30 ENCOUNTER — Encounter (HOSPITAL_COMMUNITY): Payer: Self-pay | Admitting: Orthopedic Surgery

## 2018-11-30 ENCOUNTER — Telehealth: Payer: Self-pay | Admitting: Cardiovascular Disease

## 2018-11-30 NOTE — Telephone Encounter (Signed)
°*  STAT* If patient is at the pharmacy, call can be transferred to refill team.   1. Which medications need to be refilled? (please list name of each medication and dose if known) losartan (COZAAR) 25 MG tablet  2. Which pharmacy/location (including street and city if local pharmacy) is medication to be sent to?[id number] - [place], [place] - [address]  3. Do they need a 30 day or 90 day supply? 90 days    

## 2018-12-01 MED ORDER — LOSARTAN POTASSIUM 25 MG PO TABS: 25.0000 mg | ORAL_TABLET | Freq: Every day | ORAL | 3 refills | Status: DC

## 2018-12-01 NOTE — Telephone Encounter (Signed)
Refill sent to the pharmacy electronically.  

## 2018-12-09 NOTE — Discharge Summary (Signed)
Physician Discharge Summary   Patient ID: Michelle Lloyd MRN: [id number] DOB/AGE: 12-Jun-1966 53 y.o.  Admit date: 11/27/2018 Discharge date: 11/28/2018  Primary Diagnosis: Osteoarthritis, left hip   Admission Diagnoses:  Past Medical History:  Diagnosis Date  . Allergy   . Anemia   . Bilateral primary osteoarthritis of hip   . Blood transfusion without reported diagnosis   . Cardiac device in situ 07/06/2018   [place] BIVICD , right chest  . CHF (congestive heart failure) (HCC)   . Complication of anesthesia    hard to wake up at times in past  . H/O prosthetic aortic valve replacement   . Heart murmur   . History of repair of coarctation of aorta   . Hyperlipidemia   . Non-ischemic cardiomyopathy (HCC)   . Pneumonia   . Presence of permanent cardiac pacemaker    right chest  . Systolic heart failure (HCC)   . Turner syndrome   . Turner's syndrome    Discharge Diagnoses:   Principal Problem:   OA (osteoarthritis) of hip  Estimated body mass index is 19.92 kg/m as calculated from the following:   Height as of this encounter: 5' (1.524 m).   Weight as of this encounter: 46.3 kg.  Procedure:  Procedure(s) (LRB): TOTAL HIP ARTHROPLASTY ANTERIOR APPROACH (Left)   Consults: None  HPI: Michelle Lloyd is a 53 y.o. female who has advanced end-stage arthritis of their Left  hip with progressively worsening pain and dysfunction.The patient has failed nonoperative management and presents for  total hip arthroplasty.   Laboratory Data: Admission on 11/27/2018, Discharged on 11/28/2018  Component Date Value Ref Range Status  . WBC 11/28/2018 14.7* 4.0 - 10.5 K/uL Final  . RBC 11/28/2018 2.95* 3.87 - 5.11 MIL/uL Final  . Hemoglobin 11/28/2018 9.0* 12.0 - 15.0 g/dL Final  . HCT 78/29/5621 28.9* 36.0 - 46.0 % Final  . MCV 11/28/2018 98.0  80.0 - 100.0 fL Final  . MCH 11/28/2018 30.5  26.0 - 34.0 pg Final  . MCHC 11/28/2018 31.1  30.0 - 36.0 g/dL Final  .  RDW 30/86/5784 12.3  11.5 - 15.5 % Final  . Platelets 11/28/2018 196  150 - 400 K/uL Final  . nRBC 11/28/2018 0.0  0.0 - 0.2 % Final   Performed at [facility], [address] [place], [place] [address]  . Sodium 11/28/2018 139  135 - 145 mmol/L Final  . Potassium 11/28/2018 4.5  3.5 - 5.1 mmol/L Final  . Chloride 11/28/2018 105  98 - 111 mmol/L Final  . CO2 11/28/2018 23  22 - 32 mmol/L Final  . Glucose, Bld 11/28/2018 129* 70 - 99 mg/dL Final  . BUN 52/84/1324 18  6 - 20 mg/dL Final  . Creatinine, Ser 11/28/2018 0.65  0.44 - 1.00 mg/dL Final  . Calcium 40/09/2724 8.4* 8.9 - 10.3 mg/dL Final  . GFR calc non Af Amer 11/28/2018 >60  >60 mL/min Final  . GFR calc Af Amer 11/28/2018 >60  >60 mL/min Final  . Anion gap 11/28/2018 11  5 - 15 Final   Performed at [facility], [address], [place], [place] [address]  Hospital Outpatient Visit on 11/21/2018  Component Date Value Ref Range Status  . aPTT 11/21/2018 29  24 - 36 seconds Final   Performed at [facility], [address], [place], [place] [address]  . WBC 11/21/2018 6.3  4.0 - 10.5 K/uL Final  . RBC 11/21/2018 3.88  3.87 - 5.11 MIL/uL Final  . Hemoglobin 11/21/2018 11.6* 12.0 - 15.0 g/dL Final  . HCT 97/94/8016 37.1  36.0 - 46.0 % Final  . MCV 11/21/2018 95.6  80.0 - 100.0 fL Final  . MCH 11/21/2018 29.9  26.0 - 34.0 pg Final  . MCHC 11/21/2018 31.3  30.0 - 36.0 g/dL Final  . RDW 55/37/4827 12.5  11.5 - 15.5 % Final  . Platelets 11/21/2018 247  150 - 400 K/uL Final  . nRBC 11/21/2018 0.0  0.0 - 0.2 % Final   Performed at [facility], [address] [place], [place] [address]  . Sodium 11/21/2018 137  135 - 145 mmol/L Final  . Potassium 11/21/2018 4.4  3.5 - 5.1 mmol/L Final  . Chloride 11/21/2018 101  98 - 111 mmol/L Final  . CO2 11/21/2018 27  22 - 32 mmol/L Final  . Glucose, Bld 11/21/2018 90  70 - 99 mg/dL Final  . BUN 54/49/2010 14  6 - 20  mg/dL Final  . Creatinine, Ser 11/21/2018 0.61  0.44 - 1.00 mg/dL Final  . Calcium 06/16/1974 9.3  8.9 - 10.3 mg/dL Final  . Total Protein 11/21/2018 7.1  6.5 - 8.1 g/dL Final  . Albumin 88/32/5498 4.1  3.5 - 5.0 g/dL Final  . AST 26/41/5830 24  15 - 41 U/L Final  . ALT 11/21/2018 14  0 - 44 U/L Final  . Alkaline Phosphatase 11/21/2018 77  38 - 126 U/L Final  . Total Bilirubin 11/21/2018 0.7  0.3 - 1.2 mg/dL Final  . GFR calc non Af Amer 11/21/2018 >60  >60 mL/min Final  . GFR calc Af Amer 11/21/2018 >60  >60 mL/min Final  . Anion gap 11/21/2018 9  5 - 15 Final   Performed at [facility], [address], [place], [place] [address]  . Prothrombin Time 11/21/2018 11.9  11.4 - 15.2 seconds Final  . INR 11/21/2018 0.88   Final   Performed at [facility], [address], [place], [place] [address]  . ABO/RH(D) 11/21/2018 AB POS   Final  . Antibody Screen 11/21/2018 NEG   Final  . Sample Expiration 11/21/2018 11/30/2018   Final  . Extend sample reason 11/21/2018    Final                   Value:NO TRANSFUSIONS OR PREGNANCY IN THE PAST 3 MONTHS Performed at [facility], [address] [place], [place] [address]   . MRSA, PCR 11/21/2018 NEGATIVE  NEGATIVE Final  . Staphylococcus aureus 11/21/2018 NEGATIVE  NEGATIVE Final   Comment: (NOTE) The Xpert SA Assay (FDA approved for NASAL specimens in patients 18 years of age and older), is one component of a comprehensive surveillance program. It is not intended to diagnose infection nor to guide or monitor treatment. Performed at [facility], [address] [place], [place] [address]   . ABO/RH(D) 11/21/2018    Final                   Value:AB POS Performed at [facility], [address], [place], [place] [address]      X-Rays:Dg Pelvis Portable  Result Date: 11/27/2018 CLINICAL DATA:  Left total hip arthroplasty. EXAM: PORTABLE PELVIS  1-2 VIEWS COMPARISON:  Intraoperative view same date. FINDINGS: 1646 hours. Status post left total hip arthroplasty. There is a surgical drain in the soft tissues lateral to the proximal left  femur. There is mild surrounding soft tissue emphysema. The hardware is well positioned. No evidence of acute fracture or dislocation. Advanced right hip osteoarthritic changes are noted. IMPRESSION: Status post left total hip arthroplasty without demonstrated complication. Advanced right hip osteoarthritis. Electronically Signed   By: Carey BullocksWilliam  Veazey M.D.   On: 11/27/2018 17:25   Dg C-arm 1-60 Min-no Report  Result Date: 11/27/2018 Fluoroscopy was utilized by the requesting physician.  No radiographic interpretation.   Dg Hip Operative Unilat With Pelvis Left  Result Date: 11/27/2018 CLINICAL DATA:  Left total hip arthroplasty. EXAM: OPERATIVE LEFT HIP (WITH PELVIS IF PERFORMED)  VIEWS TECHNIQUE: Fluoroscopic spot image(s) were submitted for interpretation post-operatively. COMPARISON:  None. FINDINGS: Spot AP fluoroscopic images of the left hip and symphysis pubis demonstrate left total hip arthroplasty without evidence of acute fracture or dislocation. The hardware is well positioned. IMPRESSION: No demonstrated complication following left total hip arthroplasty. Electronically Signed   By: Carey BullocksWilliam  Veazey M.D.   On: 11/27/2018 17:24    EKG: Orders placed or performed in visit on 11/01/18  . EKG 12-Lead     Hospital Course: Michelle Lloyd is a 53 y.o. who was [facility]. They were brought to the operating room on 11/27/2018 and underwent Procedure(s): TOTAL HIP ARTHROPLASTY ANTERIOR APPROACH.  Patient tolerated the procedure well and was later transferred to the recovery room and then to the orthopaedic floor for postoperative care. They were given PO and IV analgesics for pain control following their surgery. They were given 24 hours of postoperative antibiotics of    Anti-infectives (From admission, onward)   Start     Dose/Rate Route Frequency Ordered Stop   11/28/18 0600  ceFAZolin (ANCEF) IVPB 2g/100 mL premix     2 g 200 mL/hr over 30 Minutes Intravenous On call to O.R. 11/27/18 1230 11/27/18 1507   11/27/18 2200  ceFAZolin (ANCEF) IVPB 1 g/50 mL premix     1 g 100 mL/hr over 30 Minutes Intravenous Every 6 hours 11/27/18 1755 11/28/18 0354     and started on DVT prophylaxis in the form of Aspirin.   PT and OT were ordered for total joint protocol. Discharge planning consulted to help with postop disposition and equipment needs.  Patient had a good night on the evening of surgery. They started to get up OOB with therapy on POD #1. Pt was seen during rounds and was ready to go home pending progress with therapy. Hemovac drain was pulled without difficulty. She worked with therapy on POD #1 and was meeting her goals. Pt was discharged to home later that day in stable condition.  Diet: Cardiac diet Activity: WBAT Follow-up: in 2 weeks with Dr. Lequita HaltAluisio Disposition: Home with HEP Discharged Condition: stable   Discharge Instructions    Call MD / Call 911   Complete by:  As directed    If you experience chest pain or shortness of breath, CALL 911 and be transported to the hospital emergency room.  If you develope a fever above 101 F, pus (white drainage) or increased drainage or redness at the wound, or calf pain, call your surgeon's office.   Change dressing   Complete by:  As directed    You may change your dressing on Wednesday, then change the dressing daily with sterile 4 x 4 inch gauze dressing and paper tape.   Constipation Prevention   Complete by:  As directed    Drink plenty of fluids.  Prune juice may be  helpful.  You may use a stool softener, such as Colace (over the counter) 100 mg twice a day.  Use MiraLax (over the counter) for constipation as needed.   Diet - low sodium heart healthy   Complete by:  As directed    Discharge  instructions   Complete by:  As directed    Dr. Ollen Gross Total Joint Specialist [facility] [address] [place], [place] [address] [phone]  ANTERIOR APPROACH TOTAL HIP REPLACEMENT POSTOPERATIVE DIRECTIONS   Hip Rehabilitation, Guidelines Following Surgery  The results of a hip operation are greatly improved after range of motion and muscle strengthening exercises. Follow all safety measures which are given to protect your hip. If any of these exercises cause increased pain or swelling in your joint, decrease the amount until you are comfortable again. Then slowly increase the exercises. Call your caregiver if you have problems or questions.   HOME CARE INSTRUCTIONS  Remove items at home which could result in a fall. This includes throw rugs or furniture in walking pathways.  ICE to the affected hip every three hours for 30 minutes at a time and then as needed for pain and swelling.  Continue to use ice on the hip for pain and swelling from surgery. You may notice swelling that will progress down to the foot and ankle.  This is normal after surgery.  Elevate the leg when you are not up walking on it.   Continue to use the breathing machine which will help keep your temperature down.  It is common for your temperature to cycle up and down following surgery, especially at night when you are not up moving around and exerting yourself.  The breathing machine keeps your lungs expanded and your temperature down.  DIET You may resume your previous home diet once your are discharged from the hospital.  DRESSING / WOUND CARE / SHOWERING You may shower 3 days after surgery, but keep the wounds dry during showering.  You may use an occlusive plastic wrap (Press'n Seal for example), NO SOAKING/SUBMERGING IN THE BATHTUB.  If the bandage gets wet, change with a clean dry gauze.  If the incision gets wet, pat the wound dry with a clean towel. You may start showering once you are  discharged home but do not submerge the incision under water. Just pat the incision dry and apply a dry gauze dressing on daily. Change the surgical dressing daily and reapply a dry dressing each time.  ACTIVITY Walk with your walker as instructed. Use walker as long as suggested by your caregivers. Avoid periods of inactivity such as sitting longer than an hour when not asleep. This helps prevent blood clots.  You may resume a sexual relationship in one month or when given the OK by your doctor.  You may return to work once you are cleared by your doctor.  Do not drive a car for 6 weeks or until released by you surgeon.  Do not drive while taking narcotics.  WEIGHT BEARING Weight bearing as tolerated with assist device (walker, cane, etc) as directed, use it as long as suggested by your surgeon or therapist, typically at least 4-6 weeks.  POSTOPERATIVE CONSTIPATION PROTOCOL Constipation - defined medically as fewer than three stools per week and severe constipation as less than one stool per week.  One of the most common issues patients have following surgery is constipation.  Even if you have a regular bowel pattern at home, your normal regimen is  likely to be disrupted due to multiple reasons following surgery.  Combination of anesthesia, postoperative narcotics, change in appetite and fluid intake all can affect your bowels.  In order to avoid complications following surgery, here are some recommendations in order to help you during your recovery period.  Colace (docusate) - Pick up an over-the-counter form of Colace or another stool softener and take twice a day as long as you are requiring postoperative pain medications.  Take with a full glass of water daily.  If you experience loose stools or diarrhea, hold the colace until you stool forms back up.  If your symptoms do not get better within 1 week or if they get worse, check with your doctor.  Dulcolax (bisacodyl) - Pick up  over-the-counter and take as directed by the product packaging as needed to assist with the movement of your bowels.  Take with a full glass of water.  Use this product as needed if not relieved by Colace only.   MiraLax (polyethylene glycol) - Pick up over-the-counter to have on hand.  MiraLax is a solution that will increase the amount of water in your bowels to assist with bowel movements.  Take as directed and can mix with a glass of water, juice, soda, coffee, or tea.  Take if you go more than two days without a movement. Do not use MiraLax more than once per day. Call your doctor if you are still constipated or irregular after using this medication for 7 days in a row.  If you continue to have problems with postoperative constipation, please contact the office for further assistance and recommendations.  If you experience "the worst abdominal pain ever" or develop nausea or vomiting, please contact the office immediatly for further recommendations for treatment.  ITCHING  If you experience itching with your medications, try taking only a single pain pill, or even half a pain pill at a time.  You can also use Benadryl over the counter for itching or also to help with sleep.   TED HOSE STOCKINGS Wear the elastic stockings on both legs for three weeks following surgery during the day but you may remove then at night for sleeping.  MEDICATIONS See your medication summary on the "After Visit Summary" that the nursing staff will review with you prior to discharge.  You may have some home medications which will be placed on hold until you complete the course of blood thinner medication.  It is important for you to complete the blood thinner medication as prescribed by your surgeon.  Continue your approved medications as instructed at time of discharge.  PRECAUTIONS If you experience chest pain or shortness of breath - call 911 immediately for transfer to the hospital emergency department.  If you  develop a fever greater that 101 F, purulent drainage from wound, increased redness or drainage from wound, foul odor from the wound/dressing, or calf pain - CONTACT YOUR SURGEON.                                                   FOLLOW-UP APPOINTMENTS Make sure you keep all of your appointments after your operation with your surgeon and caregivers. You should call the office at the above phone number and make an appointment for approximately two weeks after the date of your surgery or on the date instructed by  your surgeon outlined in the "After Visit Summary".  RANGE OF MOTION AND STRENGTHENING EXERCISES  These exercises are designed to help you keep full movement of your hip joint. Follow your caregiver's or physical therapist's instructions. Perform all exercises about fifteen times, three times per day or as directed. Exercise both hips, even if you have had only one joint replacement. These exercises can be done on a training (exercise) mat, on the floor, on a table or on a bed. Use whatever works the best and is most comfortable for you. Use music or television while you are exercising so that the exercises are a pleasant break in your day. This will make your life better with the exercises acting as a break in routine you can look forward to.  Lying on your back, slowly slide your foot toward your buttocks, raising your knee up off the floor. Then slowly slide your foot back down until your leg is straight again.  Lying on your back spread your legs as far apart as you can without causing discomfort.  Lying on your side, raise your upper leg and foot straight up from the floor as far as is comfortable. Slowly lower the leg and repeat.  Lying on your back, tighten up the muscle in the front of your thigh (quadriceps muscles). You can do this by keeping your leg straight and trying to raise your heel off the floor. This helps strengthen the largest muscle supporting your knee.  Lying on your back,  tighten up the muscles of your buttocks both with the legs straight and with the knee bent at a comfortable angle while keeping your heel on the floor.   IF YOU ARE TRANSFERRED TO A SKILLED REHAB FACILITY If the patient is transferred to a skilled rehab facility following release from the hospital, a list of the current medications will be sent to the facility for the patient to continue.  When discharged from the skilled rehab facility, please have the facility set up the patient's Home Health Physical Therapy prior to being released. Also, the skilled facility will be responsible for providing the patient with their medications at time of release from the facility to include their pain medication, the muscle relaxants, and their blood thinner medication. If the patient is still at the rehab facility at time of the two week follow up appointment, the skilled rehab facility will also need to assist the patient in arranging follow up appointment in our office and any transportation needs.  MAKE SURE YOU:  Understand these instructions.  Get help right away if you are not doing well or get worse.    Pick up stool softner and laxative for home use following surgery while on pain medications. Do not submerge incision under water. Please use good hand washing techniques while changing dressing each day. May shower starting three days after surgery. Please use a clean towel to pat the incision dry following showers. Continue to use ice for pain and swelling after surgery. Do not use any lotions or creams on the incision until instructed by your surgeon.   Do not sit on low chairs, stoools or toilet seats, as it may be difficult to get up from low surfaces   Complete by:  As directed    Driving restrictions   Complete by:  As directed    No driving for two weeks   TED hose   Complete by:  As directed    Use stockings (TED hose) for three weeks  on both leg(s).  You may remove them at night for  sleeping.   Weight bearing as tolerated   Complete by:  As directed      Allergies as of 11/28/2018      Reactions   Lisinopril Nausea Only      Medication List    STOP taking these medications   aspirin EC 81 MG tablet   Coenzyme Q10 100 MG capsule   Garlic 100 MG Tabs   Magnesium 400 MG Tabs   Potassium 99 MG Tabs   vitamin C 1000 MG tablet   Vitamin D3 125 MCG (5000 UT) Tabs   Vitamin K2 100 MCG Tabs     TAKE these medications   acetaminophen 650 MG CR tablet Commonly known as:  TYLENOL Take 650 mg by mouth daily.   ferrous sulfate 325 (65 FE) MG tablet Take 325 mg by mouth daily with breakfast.   furosemide 20 MG tablet Commonly known as:  LASIX Take 20 mg by mouth daily.   HYDROcodone-acetaminophen 5-325 MG tablet Commonly known as:  NORCO/VICODIN Take 1-2 tablets by mouth every 6 (six) hours as needed for severe pain (pain score 4-6).   methocarbamol 500 MG tablet Commonly known as:  ROBAXIN Take 1 tablet (500 mg total) by mouth every 6 (six) hours as needed for muscle spasms.   metoprolol succinate 25 MG 24 hr tablet Commonly known as:  TOPROL-XL Take 25 mg by mouth daily.   omega-3 acid ethyl esters 1 g capsule Commonly known as:  LOVAZA Take 1 g by mouth daily.   rivaroxaban 10 MG Tabs tablet Commonly known as:  XARELTO Take 1 tablet (10 mg total) by mouth daily with breakfast for 21 days. Then resume one 81 mg aspirin once a day.   traMADol 50 MG tablet Commonly known as:  ULTRAM Take 1-2 tablets (50-100 mg total) by mouth every 6 (six) hours as needed for moderate pain. What changed:    how much to take  when to take this  reasons to take this            Discharge Care Instructions  (From admission, onward)         Start     Ordered   11/27/18 0000  Weight bearing as tolerated     11/27/18 1824   11/27/18 0000  Change dressing    Comments:  You may change your dressing on Wednesday, then change the dressing daily with  sterile 4 x 4 inch gauze dressing and paper tape.   11/27/18 1824         Follow-up Information    Ollen Gross, MD. Schedule an appointment as soon as possible for a visit on 12/12/2018.   Specialty:  Orthopedic Surgery Contact information: [address] [place] [address] [place] [address] [phone]           Signed: Arther Abbott, PA-C Orthopedic Surgery 12/09/2018, 6:02 PM

## 2018-12-11 DIAGNOSIS — Z96642 Presence of left artificial hip joint: Secondary | ICD-10-CM | POA: Insufficient documentation

## 2018-12-18 ENCOUNTER — Telehealth: Payer: Self-pay | Admitting: Cardiovascular Disease

## 2018-12-18 NOTE — Telephone Encounter (Signed)
Agree.  Follow up with PCP or neurology.

## 2018-12-18 NOTE — Telephone Encounter (Signed)
New Message   Patient is calling because she has been experiencing a tingling sensation on the right side of her body. Please call to discuss.

## 2018-12-18 NOTE — Telephone Encounter (Signed)
Returned call to patient of Dr. Duke Salvia. She reports tingling sensation on R side of body last night and then a little while ago. She states this feels like when you lay on your arm/leg too long and then it resolves.   She recently had L hip surgery. She has 1 dose xarelto left. She has not been on losartan, magnesium, or potassium since then per surgeon.   She has ICD on right side.  She reports no BP issues.   She denies CP, SOB. She felt a little nauseous last night, but this was fleeting.   She denies speech issues, blurry vision.   Advised her that concerns do not sound cardiac in nature, but will route to Dr. Duke Salvia to review. She voiced understanding.

## 2018-12-19 NOTE — Telephone Encounter (Signed)
Left message to call back  

## 2018-12-20 ENCOUNTER — Telehealth: Payer: Self-pay

## 2018-12-20 NOTE — Telephone Encounter (Signed)
Primary Cardiologist: [facility] Pre-operative Risk Assessment    Request for surgical clearance:  1. What type of surgery is being performed? Right total hip   2. When is this surgery scheduled? 02/28/19   3. What type of clearance is required (medical clearance vs. Pharmacy clearance to hold med vs. Both)? Both   4. Are there any medications that need to be held prior to surgery and how long? Aspirin   5. Practice name and name of physician performing surgery?  [facility]/ Dr.Aluisio   6. What is your office phone number [phone]    7.   What is your office fax number [phone]  8.   Anesthesia type (None, local, MAC, general) ? Choice    Michelle Lloyd 12/20/2018, 12:14 PM  _________________________________________________________________   (provider comments below)

## 2018-12-20 NOTE — Telephone Encounter (Signed)
   Primary Cardiologist: Chilton Si, MD  Chart reviewed as part of pre-operative protocol coverage. Patient was last seen by Dr. Duke Salvia 11/01/18 and cleared as " Upon evaluation today, she can achieve 4 METs or greater without anginal symptoms.  According to [facility] and AHA guidelines, she requires no further cardiac workup prior to her noncardiac surgery and should be at acceptable risk.  her NSQIP risk of peri-procedural MI or cardiac arrest is 0.02%.  Our service is available as necessary in the perioperative period".  During my conversation today she did not have any cardiac issue. Still able to get at least 4 mets of activity. However she is experiencing a tingling sensation on the right side of her body. Dr. Duke Salvia has recommended (I agree) evaluation with PCP or neurology.   In regards to cardiac clearance, given past medical history and time since last visit, based on ACC/AHA guidelines, Olamide Alpha would be at acceptable risk for the planned procedure without further cardiovascular testing.   I will route this recommendation to the requesting party via Epic fax function and remove from pre-op pool.  Please call with questions.  [place], [place] 12/20/2018, 2:03 PM

## 2018-12-21 NOTE — Telephone Encounter (Signed)
Advised patient

## 2019-02-07 NOTE — H&P (Signed)
TOTAL HIP ADMISSION H&P  Patient is admitted for right total hip arthroplasty.  Subjective:  Chief Complaint: right hip pain  HPI: Michelle Lloyd, 53 y.o. female, has a history of pain and functional disability in the right hip(s) due to arthritis and patient has failed non-surgical conservative treatments for greater than 12 weeks to include corticosteriod injections and activity modification.  Onset of symptoms was gradual starting 2 years ago with gradually worsening course since that time.The patient noted no past surgery on the right hip(s).  Patient currently rates pain in the right hip at 5 out of 10 with activity. Patient has worsening of pain with activity and weight bearing and crepitus. Patient has evidence of bone-on-bone arthritis by imaging studies. This condition presents safety issues increasing the risk of falls. There is no current active infection.  Patient Active Problem List   Diagnosis Date Noted  . OA (osteoarthritis) of hip 11/27/2018  . Cardiac arrest (HCC) 10/08/2017  . ICD (implantable cardioverter-defibrillator) in place 10/08/2017  . S/P repair of coarctation of aorta 10/08/2017  . Turner syndrome 10/08/2017  . HTN (hypertension) 10/06/2017  . 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 NICM (nonischemic cardiomyopathy) (HCC) 10/06/2017  . Aortic valve disorder 01/12/2017  . Ventricular tachycardia (HCC) 01/12/2017  . Syncope 01/11/2017   Past Medical History:  Diagnosis Date  . Allergy   . Anemia   . Bilateral primary osteoarthritis of hip   . Blood transfusion without reported diagnosis   . Cardiac device in situ 07/06/2018   Biotronik BIVICD , right chest  . CHF (congestive heart failure) (HCC)   . Complication of anesthesia    hard to wake up at times in past  . H/O prosthetic aortic valve replacement   . Heart murmur   . History of repair of coarctation of aorta   . Hyperlipidemia   . Non-ischemic cardiomyopathy (HCC)   .  Pneumonia   . Presence of permanent cardiac pacemaker    right chest  . Systolic heart failure (HCC)   . Turner syndrome   . Turner's syndrome     Past Surgical History:  Procedure Laterality Date  . CARDIAC ASSIST DEVICE REMOVAL    . EYE SURGERY    . PACEMAKER INSERTION    . TONSILLECTOMY    . TOTAL HIP ARTHROPLASTY Left 11/27/2018   Procedure: TOTAL HIP ARTHROPLASTY ANTERIOR APPROACH;  Surgeon: Ollen Gross, MD;  Location: [facility];  Service: Orthopedics;  Laterality: Left;     No current facility-administered medications for this encounter.    Current Outpatient Medications  Medication Sig Dispense Refill Last Dose  . acetaminophen (TYLENOL) 650 MG CR tablet Take 650 mg by mouth daily.   11/26/2018 at Unknown time  . ferrous sulfate 325 (65 FE) MG tablet Take 325 mg by mouth daily with breakfast.   Past Week at Unknown time  . furosemide (LASIX) 20 MG tablet Take 20 mg by mouth daily.    11/26/2018 at Unknown time  . HYDROcodone-acetaminophen (NORCO/VICODIN) 5-325 MG tablet Take 1-2 tablets by mouth every 6 (six) hours as needed for severe pain (pain score 4-6). 56 tablet 0   . losartan (COZAAR) 25 MG tablet Take 1 tablet (25 mg total) by mouth daily. 90 tablet 3   . methocarbamol (ROBAXIN) 500 MG tablet Take 1 tablet (500 mg total) by mouth every 6 (six) hours as needed for muscle spasms. 40 tablet 0   . metoprolol succinate (TOPROL-XL) 25 MG 24 hr tablet  Take 25 mg by mouth daily.   11/27/2018 at 0730  . omega-3 acid ethyl esters (LOVAZA) 1 g capsule Take 1 g by mouth daily.    Past Week at Unknown time  . rivaroxaban (XARELTO) 10 MG TABS tablet Take 1 tablet (10 mg total) by mouth daily with breakfast for 21 days. Then resume one 81 mg aspirin once a day. 21 tablet 0   . traMADol (ULTRAM) 50 MG tablet Take 1-2 tablets (50-100 mg total) by mouth every 6 (six) hours as needed for moderate pain. 40 tablet 0    Allergies  Allergen Reactions  . Lisinopril Nausea Only      Social History   Tobacco Use  . Smoking status: Never Smoker  . Smokeless tobacco: Never Used  Substance Use Topics  . Alcohol use: Not Currently    Family History  Problem Relation Age of Onset  . Breast cancer Mother   . Cataracts Mother   . Stroke Father   . Atrial fibrillation Father   . Kidney disease Sister      Review of Systems  Constitutional: Negative for chills and fever.  HENT: Negative for congestion, sore throat and tinnitus.   Eyes: Negative for double vision, photophobia and pain.  Respiratory: Negative for cough, shortness of breath and wheezing.   Cardiovascular: Negative for chest pain, palpitations and orthopnea.  Gastrointestinal: Negative for heartburn, nausea and vomiting.  Genitourinary: Negative for dysuria, frequency and urgency.  Musculoskeletal: Positive for joint pain.  Neurological: Negative for dizziness, weakness and headaches.    Objective:  Physical Exam  Well nourished and well developed. Ambulates with walker. General: Alert and oriented x3, cooperative and pleasant, no acute distress. Head:normocephalic, atraumatic, neck supple. Eye: EOMI. Respiratory: breath sounds clear in all fields, no wheezing, rales, or rhonchi. Cardiovascular: Regular rate and rhythm, no murmurs, gallops or rubs.  Abdomen: non-tender to palpation and soft, normoactive bowel sounds.  Musculoskeletal: Left hip exam: No swelling.  Well healed incisions without tenderness. Range of motion: Flexion 110 degrees, Internal rotation 10 degrees, External rotation 20 degrees, Abduction 20 degrees without pain.   Right Hip Exam: ROM: The right is essentially fused.   Calves soft and nontender. Motor function intact in LE. Strength 5/5 LE bilaterally. Neuro: Distal pulses 2+. Sensation to light touch intact in LE.  Vital signs in last 24 hours: Blood pressure: 102/58 mmHg  Labs:   Estimated body mass index is 19.92 kg/m as calculated from the following:    Height as of 11/27/18: 5' (1.524 m).   Weight as of 11/27/18: 46.3 kg.   Imaging Review Plain radiographs demonstrate severe degenerative joint disease of the left hip(s). The bone quality appears to be adequate for age and reported activity level.      Assessment/Plan:  End stage arthritis, left hip(s)  The patient history, physical examination, clinical judgement of the provider and imaging studies are consistent with end stage degenerative joint disease of the left hip(s) and total hip arthroplasty is deemed medically necessary. The treatment options including medical management, injection therapy, arthroscopy and arthroplasty were discussed at length. The risks and benefits of total hip arthroplasty were presented and reviewed. The risks due to aseptic loosening, infection, stiffness, dislocation/subluxation,  thromboembolic complications and other imponderables were discussed.  The patient acknowledged the explanation, agreed to proceed with the plan and consent was signed. Patient is being admitted for inpatient treatment for surgery, pain control, PT, OT, prophylactic antibiotics, VTE prophylaxis, progressive ambulation and ADL's and discharge  planning.The patient is planning to be discharged home.   Anticipated LOS equal to or greater than 2 midnights due to - Age 67 and older with one or more of the following:  - Obesity  - Expected need for hospital services (PT, OT, Nursing) required for safe  discharge  - Anticipated need for postoperative skilled nursing care or inpatient rehab  - Active co-morbidities: None OR   - Unanticipated findings during/Post Surgery: None  - Patient is a high risk of re-admission due to: None   Therapy Plans: HEP Disposition: Home with father and step-mom Planned DVT Prophylaxis: Xarelto 10mg  daily (Aortic valve replacement) DME needed: none Cardiologist: Dr. Duke Salvia PCP: Dr. Leretha Pol TXA: IV Allergies: Lisinopril - nausea Anesthesia  Concerns: none BMI: 20.4 Other: Hx of aortic valve replacement. Patient has pacemaker. Nausea with hydrocodone, used Tramadol with left THA. Hx of hypokalemia.   - Patient was instructed on what medications to stop prior to surgery. - Follow-up visit in 2 weeks with Dr. Lequita Halt - Begin physical therapy following surgery - Pre-operative lab work as pre-surgical testing - Prescriptions will be provided in hospital at time of discharge  Arther Abbott, PA-C Orthopedic Surgery [facility] [place]

## 2019-02-20 NOTE — Patient Instructions (Addendum)
Michelle Lloyd  02/20/2019   Your procedure is scheduled on: 02-28-19    Report to [facility] Main  Entrance    Report to Admitting at 8:50 AM    Call this number if you have problems the morning of surgery [phone]    Remember: Do not eat food or drink liquids :After Midnight.    BRUSH YOUR TEETH MORNING OF SURGERY AND RINSE YOUR MOUTH OUT, NO CHEWING GUM CANDY OR MINTS.     Take these medicines the morning of surgery with A SIP OF WATER: Metoprolol Succinate (Toprol-XL),   Tylenol if needed                                You may not have any metal on your body including hair pins and              piercings  Do not wear jewelry, make-up, lotions, powders or perfumes, deodorant             Do not wear nail polish.  Do not shave  48 hours prior to surgery.               Do not bring valuables to the hospital. [facility] IS NOT             RESPONSIBLE   FOR VALUABLES.  Contacts, dentures or bridgework may not be worn into surgery.  Leave suitcase in the car. After surgery it may be brought to your room.                   Please read over the following fact sheets you were given: _____________________________________________________________________             [facility] - Preparing for Surgery Before surgery, you can play an important role.  Because skin is not sterile, your skin needs to be as free of germs as possible.  You can reduce the number of germs on your skin by washing with CHG (chlorahexidine gluconate) soap before surgery.  CHG is an antiseptic cleaner which kills germs and bonds with the skin to continue killing germs even after washing. Please DO NOT use if you have an allergy to CHG or antibacterial soaps.  If your skin becomes reddened/irritated stop using the CHG and inform your nurse when you arrive at Short Stay. Do not shave (including legs and underarms) for at least 48 hours prior to the first CHG shower.  You may  shave your face/neck. Please follow these instructions carefully:  1.  Shower with CHG Soap the night before surgery and the  morning of Surgery.  2.  If you choose to wash your hair, wash your hair first as usual with your  normal  shampoo.  3.  After you shampoo, rinse your hair and body thoroughly to remove the  shampoo.                           4.  Use CHG as you would any other liquid soap.  You can apply chg directly  to the skin and wash                       Gently with a scrungie or clean washcloth.  5.  Apply the CHG  Soap to your body ONLY FROM THE NECK DOWN.   Do not use on face/ open                           Wound or open sores. Avoid contact with eyes, ears mouth and genitals (private parts).                       Wash face,  Genitals (private parts) with your normal soap.             6.  Wash thoroughly, paying special attention to the area where your surgery  will be performed.  7.  Thoroughly rinse your body with warm water from the neck down.  8.  DO NOT shower/wash with your normal soap after using and rinsing off  the CHG Soap.                9.  Pat yourself dry with a clean towel.            10.  Wear clean pajamas.            11.  Place clean sheets on your bed the night of your first shower and do not  sleep with pets. Day of Surgery : Do not apply any lotions/deodorants the morning of surgery.  Please wear clean clothes to the hospital/surgery center.  FAILURE TO FOLLOW THESE INSTRUCTIONS MAY RESULT IN THE CANCELLATION OF YOUR SURGERY PATIENT SIGNATURE_________________________________  NURSE SIGNATURE__________________________________  ________________________________________________________________________   Adam Phenix  An incentive spirometer is a tool that can help keep your lungs clear and active. This tool measures how well you are filling your lungs with each breath. Taking long deep breaths may help reverse or decrease the chance of developing  breathing (pulmonary) problems (especially infection) following:  A long period of time when you are unable to move or be active. BEFORE THE PROCEDURE   If the spirometer includes an indicator to show your best effort, your nurse or respiratory therapist will set it to a desired goal.  If possible, sit up straight or lean slightly forward. Try not to slouch.  Hold the incentive spirometer in an upright position. INSTRUCTIONS FOR USE  1. Sit on the edge of your bed if possible, or sit up as far as you can in bed or on a chair. 2. Hold the incentive spirometer in an upright position. 3. Breathe out normally. 4. Place the mouthpiece in your mouth and seal your lips tightly around it. 5. Breathe in slowly and as deeply as possible, raising the piston or the ball toward the top of the column. 6. Hold your breath for 3-5 seconds or for as long as possible. Allow the piston or ball to fall to the bottom of the column. 7. Remove the mouthpiece from your mouth and breathe out normally. 8. Rest for a few seconds and repeat Steps 1 through 7 at least 10 times every 1-2 hours when you are awake. Take your time and take a few normal breaths between deep breaths. 9. The spirometer may include an indicator to show your best effort. Use the indicator as a goal to work toward during each repetition. 10. After each set of 10 deep breaths, practice coughing to be sure your lungs are clear. If you have an incision (the cut made at the time of surgery), support your incision when coughing by placing a pillow or rolled up towels  firmly against it. Once you are able to get out of bed, walk around indoors and cough well. You may stop using the incentive spirometer when instructed by your caregiver.  RISKS AND COMPLICATIONS  Take your time so you do not get dizzy or light-headed.  If you are in pain, you may need to take or ask for pain medication before doing incentive spirometry. It is harder to take a deep  breath if you are having pain. AFTER USE  Rest and breathe slowly and easily.  It can be helpful to keep track of a log of your progress. Your caregiver can provide you with a simple table to help with this. If you are using the spirometer at home, follow these instructions: [place] IF:   You are having difficultly using the spirometer.  You have trouble using the spirometer as often as instructed.  Your pain medication is not giving enough relief while using the spirometer.  You develop fever of 100.5 F (38.1 C) or higher. SEEK IMMEDIATE MEDICAL CARE IF:   You cough up bloody sputum that had not been present before.  You develop fever of 102 F (38.9 C) or greater.  You develop worsening pain at or near the incision site. MAKE SURE YOU:   Understand these instructions.  Will watch your condition.  Will get help right away if you are not doing well or get worse. Document Released: 04/04/2007 Document Revised: 02/14/2012 Document Reviewed: 06/05/2007 ExitCare Patient Information 2014 Exit[employer], [place].   ________________________________________________________________________  WHAT IS A BLOOD TRANSFUSION? Blood Transfusion Information  A transfusion is the replacement of blood or some of its parts. Blood is made up of multiple cells which provide different functions.  Red blood cells carry oxygen and are used for blood loss replacement.  White blood cells fight against infection.  Platelets control bleeding.  Plasma helps clot blood.  Other blood products are available for specialized needs, such as hemophilia or other clotting disorders. BEFORE THE TRANSFUSION  Who gives blood for transfusions?   Healthy volunteers who are fully evaluated to make sure their blood is safe. This is blood bank blood. Transfusion therapy is the safest it has ever been in the practice of medicine. Before blood is taken from a donor, a complete history is taken to make sure  that person has no history of diseases nor engages in risky social behavior (examples are intravenous drug use or sexual activity with multiple partners). The donor's travel history is screened to minimize risk of transmitting infections, such as malaria. The donated blood is tested for signs of infectious diseases, such as HIV and hepatitis. The blood is then tested to be sure it is compatible with you in order to minimize the chance of a transfusion reaction. If you or a relative donates blood, this is often done in anticipation of surgery and is not appropriate for emergency situations. It takes many days to process the donated blood. RISKS AND COMPLICATIONS Although transfusion therapy is very safe and saves many lives, the main dangers of transfusion include:   Getting an infectious disease.  Developing a transfusion reaction. This is an allergic reaction to something in the blood you were given. Every precaution is taken to prevent this. The decision to have a blood transfusion has been considered carefully by your caregiver before blood is given. Blood is not given unless the benefits outweigh the risks. AFTER THE TRANSFUSION  Right after receiving a blood transfusion, you will usually feel much better  and more energetic. This is especially true if your red blood cells have gotten low (anemic). The transfusion raises the level of the red blood cells which carry oxygen, and this usually causes an energy increase.  The nurse administering the transfusion will monitor you carefully for complications. HOME CARE INSTRUCTIONS  No special instructions are needed after a transfusion. You may find your energy is better. Speak with your caregiver about any limitations on activity for underlying diseases you may have. SEEK MEDICAL CARE IF:   Your condition is not improving after your transfusion.  You develop redness or irritation at the intravenous (IV) site. SEEK IMMEDIATE MEDICAL CARE IF:  Any of  the following symptoms occur over the next 12 hours:  Shaking chills.  You have a temperature by mouth above 102 F (38.9 C), not controlled by medicine.  Chest, back, or muscle pain.  People around you feel you are not acting correctly or are confused.  Shortness of breath or difficulty breathing.  Dizziness and fainting.  You get a rash or develop hives.  You have a decrease in urine output.  Your urine turns a dark color or changes to pink, red, or brown. Any of the following symptoms occur over the next 10 days:  You have a temperature by mouth above 102 F (38.9 C), not controlled by medicine.  Shortness of breath.  Weakness after normal activity.  The white part of the eye turns yellow (jaundice).  You have a decrease in the amount of urine or are urinating less often.  Your urine turns a dark color or changes to pink, red, or brown. Document Released: 11/19/2000 Document Revised: 02/14/2012 Document Reviewed: 07/08/2008 [facility] Patient Information 2014 [place], [place].  _______________________________________________________________________

## 2019-02-20 NOTE — Progress Notes (Signed)
11-01-18 (Epic) EKG  12-20-18 (Epic) Cardiac Clearance in Telephone Encounter

## 2019-02-21 ENCOUNTER — Encounter (HOSPITAL_COMMUNITY): Payer: Self-pay

## 2019-02-21 ENCOUNTER — Other Ambulatory Visit: Payer: Self-pay

## 2019-02-21 ENCOUNTER — Encounter (HOSPITAL_COMMUNITY)
Admission: RE | Admit: 2019-02-21 | Discharge: 2019-02-21 | Disposition: A | Discharge status: Home / Self Care | Payer: Managed Care, Other (non HMO) | Source: Ambulatory Visit | Attending: Orthopedic Surgery | Admitting: Orthopedic Surgery

## 2019-02-21 DIAGNOSIS — Z01812 Encounter for preprocedural laboratory examination: Secondary | ICD-10-CM | POA: Insufficient documentation

## 2019-02-21 DIAGNOSIS — M1611 Unilateral primary osteoarthritis, right hip: Secondary | ICD-10-CM | POA: Diagnosis not present

## 2019-02-21 LAB — SURGICAL PCR SCREEN
MRSA, PCR: NEGATIVE
STAPHYLOCOCCUS AUREUS: POSITIVE — AB

## 2019-02-21 LAB — COMPREHENSIVE METABOLIC PANEL
ALT: 13 U/L (ref 0–44)
AST: 21 U/L (ref 15–41)
Albumin: 4 g/dL (ref 3.5–5.0)
Alkaline Phosphatase: 110 U/L (ref 38–126)
Anion gap: 9 (ref 5–15)
BUN: 25 mg/dL — AB (ref 6–20)
CO2: 25 mmol/L (ref 22–32)
Calcium: 9 mg/dL (ref 8.9–10.3)
Chloride: 102 mmol/L (ref 98–111)
Creatinine, Ser: 0.59 mg/dL (ref 0.44–1.00)
GFR calc Af Amer: >60 mL/min (ref >60)
Glucose, Bld: 102 mg/dL — ABNORMAL HIGH (ref 70–99)
Potassium: 3.7 mmol/L (ref 3.5–5.1)
Sodium: 136 mmol/L (ref 135–145)
Total Bilirubin: 0.7 mg/dL (ref 0.3–1.2)
Total Protein: 7.7 g/dL (ref 6.5–8.1)

## 2019-02-21 LAB — CBC
HCT: 36.2 % (ref 36.0–46.0)
Hemoglobin: 10.7 g/dL — ABNORMAL LOW (ref 12.0–15.0)
MCH: 27.4 pg (ref 26.0–34.0)
MCHC: 29.6 g/dL — ABNORMAL LOW (ref 30.0–36.0)
MCV: 92.8 fL (ref 80.0–100.0)
PLATELETS: 266 10*3/uL (ref 150–400)
RBC: 3.9 MIL/uL (ref 3.87–5.11)
RDW: 13.9 % (ref 11.5–15.5)
WBC: 6.8 10*3/uL (ref 4.0–10.5)
nRBC: 0 % (ref 0.0–0.2)

## 2019-02-21 LAB — PROTIME-INR
INR: 1 (ref 0.8–1.2)
PROTHROMBIN TIME: 12.6 s (ref 11.4–15.2)

## 2019-02-21 LAB — APTT: aPTT: 30 seconds (ref 24–36)

## 2019-02-28 ENCOUNTER — Encounter (HOSPITAL_COMMUNITY): Admission: RE | Payer: Self-pay | Source: Home / Self Care

## 2019-02-28 ENCOUNTER — Inpatient Hospital Stay (HOSPITAL_COMMUNITY)
Admission: RE | Admit: 2019-02-28 | Payer: Managed Care, Other (non HMO) | Source: Home / Self Care | Admitting: Orthopedic Surgery

## 2019-02-28 LAB — TYPE AND SCREEN
ABO/RH(D): AB POS
Antibody Screen: NEGATIVE

## 2019-02-28 SURGERY — ARTHROPLASTY, HIP, TOTAL, ANTERIOR APPROACH
Anesthesia: Choice | Laterality: Right

## 2019-03-08 ENCOUNTER — Other Ambulatory Visit: Payer: Self-pay

## 2019-03-08 MED ORDER — METOPROLOL SUCCINATE ER 25 MG PO TB24: 25.0000 mg | ORAL_TABLET | Freq: Every day | ORAL | 3 refills | Status: DC

## 2019-04-04 ENCOUNTER — Telehealth: Payer: Self-pay | Admitting: *Deleted

## 2019-04-04 NOTE — Telephone Encounter (Signed)
04/04/19 LMOM @ 10:18 am, re: follow up appointment.

## 2019-04-09 ENCOUNTER — Telehealth: Payer: Self-pay | Admitting: Cardiovascular Disease

## 2019-04-09 NOTE — Telephone Encounter (Signed)
Smartphone/consent/ my chart/ pre reg completed °

## 2019-04-10 ENCOUNTER — Telehealth (INDEPENDENT_AMBULATORY_CARE_PROVIDER_SITE_OTHER): Payer: Managed Care, Other (non HMO) | Admitting: Cardiovascular Disease

## 2019-04-10 ENCOUNTER — Encounter: Payer: Self-pay | Admitting: Cardiovascular Disease

## 2019-04-10 DIAGNOSIS — Z95810 Presence of automatic (implantable) cardiac defibrillator: Secondary | ICD-10-CM

## 2019-04-10 DIAGNOSIS — I469 Cardiac arrest, cause unspecified: Secondary | ICD-10-CM | POA: Diagnosis not present

## 2019-04-10 DIAGNOSIS — I428 Other cardiomyopathies: Secondary | ICD-10-CM

## 2019-04-10 DIAGNOSIS — I10 Essential (primary) hypertension: Secondary | ICD-10-CM

## 2019-04-10 NOTE — Patient Instructions (Addendum)
Medication Instructions:  Your physician recommends that you continue on your current medications as directed. Please refer to the Current Medication list given to you today.  If you need a refill on your cardiac medications before your next appointment, please call your pharmacy.   Lab work: NONE  Testing/Procedures: NONE   Follow-Up: At [facility], you and your health needs are our priority.  As part of our continuing mission to provide you with exceptional heart care, we have created designated Provider Care Teams.  These Care Teams include your primary Cardiologist (physician) and Advanced Practice Providers (APPs -  Physician Assistants and Nurse Practitioners) who all work together to provide you with the care you need, when you need it. You will need a follow up appointment in 6 months.  Please call our office 2 months in advance to schedule this appointment.  You may see Sequoia Witz Prosser, MD or one of the following Advanced Practice Providers on your designated Care Team:   Luke Kilroy, PA-C Krista Kroeger, PA-C . Callie Goodrich, PA-C   

## 2019-04-10 NOTE — Progress Notes (Signed)
Virtual Visit via Video Note   This visit type was conducted due to national recommendations for restrictions regarding the COVID-19 Pandemic (e.g. social distancing) in an effort to limit this patient's exposure and mitigate transmission in our community.  Due to her co-morbid illnesses, this patient is at least at moderate risk for complications without adequate follow up.  This format is felt to be most appropriate for this patient at this time.  All issues noted in this document were discussed and addressed.  A limited physical exam was performed with this format.  Please refer to the patient's chart for her consent to telehealth for [facility].   Date:  04/10/2019   ID:  Michelle Lloyd, DOB March 12, 1966, MRN [id number]  Patient Location: Home Provider Location: Office  PCP:  Myles LippsSantiago, Irma M, MD  Cardiologist:  Chilton Siiffany Arvada, MD  Electrophysiologist:  Dr. Aron BabaJames Daubert ([facility])  Evaluation Performed:  Follow-Up Visit  Chief Complaint:  Follow up  History of Present Illness:    Michelle Lloyd is a 53 y.o. female with chronic systolic and diastolic heart failure, Turner syndrome, status post repair of coarctation of the aorta, bicuspid aortic valve status post aortic valve replacement, ventricular tachycardia s/p ICD, and hypertension who presents  for follow-up.  Michelle Lloyd underwent repair of coarctation of the aorta in 1980 with a gortex graft.  She developed nonischemic cardiomyopathy, most recent echo reportedly revealed LVEF 30-35%.  She is also had ventricular tachycardia and had a biventricular ICD placed 02/2011.  She had an RV lead fracture and a left innominate vein occlusion in 2016.  She underwent laser lead extraction and she subsequently underwent implantation of a right-sided Biotronik ICD 06/2015.  Her LVEF improved to 50% despite downgrade to a single lead ICD.  She had sustained VT/VF arrest 01/2017 and 02/2011 one week after AVR.  She had a device infection  09/2017.  [facility]  She had a repeat echo 11/2017 that reportedly showed a reduction in her LVEF to 30%.  She is to follow up with Dr. Alden Hippaubert to be upgraded back to a Bi-V ICED.  Michelle Lloyd had a bicuspid aortic valve and underwent bioprosthetic AVR in 2012.  She is accompanied by her husband and has 2 adopted daughters ages 29 and 511.   [facility] that revealed LVEF 30% with global hypokinesis with exception of normal wall motion in the posterior myocardium and akinesis of the septum. She underwent BiV upgratd 07/2018.  Her device is on the R.  At her post op device check the LV lead was not capturing.  She had a lead revision 09/02/18.    She last followed up with Dr. Alden Hippaubert on 04/2019.  She was doing well at that time and they made plans for repeat echocardiogram once COVID-19 restrictions have been lifted.  She was scheduled for left hip surgery 02/2019 but this has been postponed.  She hasn't been exercising much due to hip pain.  Her breathing has been good except for post nasal drip. She denies chest pain, lower extremity edema, orthopnea or PND.   The patient does not have symptoms concerning for COVID-19 infection (fever, chills, cough, or new shortness of breath).    Past Medical History:  Diagnosis Date   Allergy    Anemia    Bilateral primary osteoarthritis of hip    Blood transfusion without reported diagnosis    Cardiac device in situ 07/06/2018  Biotronik BIVICD , right chest   CHF (congestive heart failure) (HCC)    Complication of anesthesia    hard to wake up at times in past   H/O prosthetic aortic valve replacement    Heart murmur    History of repair of coarctation of aorta    Hyperlipidemia    Non-ischemic cardiomyopathy (HCC)    Pneumonia    Presence of permanent cardiac pacemaker    right chest   Systolic heart failure (HCC)    Turner syndrome    Turner's syndrome    Past Surgical  History:  Procedure Laterality Date   CARDIAC ASSIST DEVICE REMOVAL     EYE SURGERY     PACEMAKER INSERTION     TONSILLECTOMY     TOTAL HIP ARTHROPLASTY Left 11/27/2018   Procedure: TOTAL HIP ARTHROPLASTY ANTERIOR APPROACH;  Surgeon: Ollen Gross, MD;  Location: [facility];  Service: Orthopedics;  Laterality: Left;      Current Meds  Medication Sig   acetaminophen (TYLENOL) 650 MG CR tablet Take 650 mg by mouth daily.   aspirin 81 MG chewable tablet Chew by mouth daily.   Coenzyme Q10 (CO Q 10 PO) Take 50 mg by mouth daily.   COLLAGEN PO Take 1 capsule by mouth daily.   ferrous sulfate 325 (65 FE) MG tablet Take 325 mg by mouth daily with breakfast.   furosemide (LASIX) 20 MG tablet Take 20 mg by mouth daily.    losartan (COZAAR) 25 MG tablet Take 1 tablet (25 mg total) by mouth daily.   Magnesium 400 MG CAPS Take 400 mg by mouth daily.   methocarbamol (ROBAXIN) 500 MG tablet Take 1 tablet (500 mg total) by mouth every 6 (six) hours as needed for muscle spasms.   metoprolol succinate (TOPROL-XL) 25 MG 24 hr tablet Take 1 tablet (25 mg total) by mouth daily.   Potassium 99 MG TABS Take 99 mg by mouth daily.   traMADol (ULTRAM) 50 MG tablet Take 1-2 tablets (50-100 mg total) by mouth every 6 (six) hours as needed for moderate pain.     Allergies:   Lisinopril   Social History   Tobacco Use   Smoking status: Never Smoker   Smokeless tobacco: Never Used  Substance Use Topics   Alcohol use: Not Currently   Drug use: Never     Family Hx: The patient's family history includes Atrial fibrillation in her father; Breast cancer in her mother; Cataracts in her mother; Kidney disease in her sister; Stroke in her father.  ROS:   Please see the history of present illness.     All other systems reviewed and are negative.   Prior CV studies:   The following studies were reviewed today:  Echo 05/2018: LVEF 30%.  Hypokinesis of the anterior, lateral, apical,  and inferior myocardium.  Akinesis of the septum.  Global longitudinal strain -11.6%.  Mitral valve leaflets abnormal and thickened.  Left atrium mildly enlarged.Normal IVC collapse.  Trivial MR and mild TR.  Labs/Other Tests and Data Reviewed:    EKG:  No ECG reviewed.  Recent Labs: 02/21/2019: ALT 13; BUN 25; Creatinine, Ser 0.59; Hemoglobin 10.7; Platelets 266; Potassium 3.7; Sodium 136   Recent Lipid Panel Lab Results  Component Value Date/Time   CHOL 238 (H) 02/27/2018 11:53 AM   TRIG 135 02/27/2018 11:53 AM   HDL 80 02/27/2018 11:53 AM   CHOLHDL 3.0 02/27/2018 11:53 AM   LDLCALC 131 (H) 02/27/2018 11:53 AM  Wt Readings from Last 3 Encounters:  04/10/19 97 lb (44 kg)  02/21/19 97 lb (44 kg)  11/27/18 102 lb (46.3 kg)     Objective:    BP 117/74    Pulse 84    Ht 5' (1.524 m)    Wt 97 lb (44 kg)    LMP  (LMP Unknown)    BMI 18.94 kg/m  GENERAL: Well-appearing.  No acute distress. HEENT: Pupils equal round.  Oral mucosa unremarkable NECK:  No jugular venous distention, no visible thyromegaly EXT:  No edema, no cyanosis no clubbing SKIN:  No rashes no nodules NEURO:  Speech fluent.  Cranial nerves grossly intact.  Moves all 4 extremities freely PSYCH:  Cognitively intact, oriented to person place and time   ASSESSMENT & PLAN:    # Chronic systolic and diastolic heart failure: # VT: # S/p Bi-V ICD:  LVEF 30% pre BI-V upgrade.  She is scheduled to have a repeat echocardi[facility] at [facility] 06/2019.  She is doing well clinically.  Continue losartan, metoprolol, and furosemide.  If she develops symptomatic heart failure would switch losartan to Entresto.  # Bicuspid aortic valve:  # S/p bioprosthetic AVR.   # Moderate prosthetic valve stenosis: Stable on echo 05/2018.  Mean aortic valve gradient 37 mmHg.  Gradients were unchanged from prior study 10/2017.  However the gradient is consistent with moderate stenosis.  I am unable to view that echo in our system.  She has no  evidence of heart failure on exam.    She will be getting a repeat echo at [facility] 06/2019 as above.  # Coarctation of the aorta: S/p Repair.  No re-stenosis on chest CT-A 10/2017.  # Hypertension: BP well-controlled on losartan and metoprolol.   # Hyperlipidemia: ASCVD 10 year risk 1.7%.  Continue omega 3.  Repeat lipids at follow-up after COVID-19.  COVID-19 Education: The signs and symptoms of COVID-19 were discussed with the patient and how to seek care for testing (follow up with PCP or arrange E-visit).  The importance of social distancing was discussed today.  Time:   Today, I have spent 17 minutes with the patient with telehealth technology discussing the above problems.     Medication Adjustments/Labs and Tests Ordered: Current medicines are reviewed at length with the patient today.  Concerns regarding medicines are outlined above.   Tests Ordered: No orders of the defined types were placed in this encounter.   Medication Changes: No orders of the defined types were placed in this encounter.   Disposition:  Follow up in 6 month(s)  Signed, Chilton Si, MD  04/10/2019 10:04 AM    [facility]

## 2019-04-24 NOTE — H&P (Signed)
TOTAL HIP ADMISSION H&P  Patient is admitted for right total hip arthroplasty.  Subjective:  Chief Complaint: right hip pain  HPI: Michelle Lloyd, 53 y.o. female, has a history of pain and functional disability in the right hip(s) due to arthritis and patient has failed non-surgical conservative treatments for greater than 12 weeks to include corticosteriod injections and activity modification.  Onset of symptoms was gradual starting 2 years ago with gradually worsening course since that time.The patient noted no past surgery on the right hip(s).  Patient currently rates pain in the right hip at 9 out of 10 with activity. Patient has worsening of pain with activity and weight bearing, pain that interfers with activities of daily living and crepitus. 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 by imaging studies. This condition presents safety issues increasing the risk of falls. There is no current active infection.  Patient Active Problem List   Diagnosis Date Noted  . OA (osteoarthritis) of hip 11/27/2018  . Cardiac arrest (HCC) 10/08/2017  . ICD (implantable cardioverter-defibrillator) in place 10/08/2017  . S/P repair of coarctation of aorta 10/08/2017  . Turner syndrome 10/08/2017  . HTN (hypertension) 10/06/2017  . 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 NICM (nonischemic cardiomyopathy) (HCC) 10/06/2017  . Aortic valve disorder 01/12/2017  . Ventricular tachycardia (HCC) 01/12/2017  . Syncope 01/11/2017   Past Medical History:  Diagnosis Date  . Allergy   . Anemia   . Bilateral primary osteoarthritis of hip   . Blood transfusion without reported diagnosis   . Cardiac device in situ 07/06/2018   Biotronik BIVICD , right chest  . CHF (congestive heart failure) (HCC)   . Complication of anesthesia    hard to wake up at times in past  . H/O prosthetic aortic valve replacement   . Heart murmur   . History of repair of  coarctation of aorta   . Hyperlipidemia   . Non-ischemic cardiomyopathy (HCC)   . Pneumonia   . Presence of permanent cardiac pacemaker    right chest  . Systolic heart failure (HCC)   . Turner syndrome   . Turner's syndrome     Past Surgical History:  Procedure Laterality Date  . CARDIAC ASSIST DEVICE REMOVAL    . EYE SURGERY    . PACEMAKER INSERTION    . TONSILLECTOMY    . TOTAL HIP ARTHROPLASTY Left 11/27/2018   Procedure: TOTAL HIP ARTHROPLASTY ANTERIOR APPROACH;  Surgeon: Ollen Gross, MD;  Location: [facility];  Service: Orthopedics;  Laterality: Left;     No current facility-administered medications for this encounter.    Current Outpatient Medications  Medication Sig Dispense Refill Last Dose  . acetaminophen (TYLENOL) 650 MG CR tablet Take 650 mg by mouth daily.   Taking  . aspirin 81 MG chewable tablet Chew by mouth daily.   Taking  . Coenzyme Q10 (CO Q 10 PO) Take 50 mg by mouth daily.   Taking  . ferrous sulfate 325 (65 FE) MG tablet Take 325 mg by mouth daily with breakfast.   Taking  . furosemide (LASIX) 20 MG tablet Take 20 mg by mouth daily.    Taking  . losartan (COZAAR) 25 MG tablet Take 1 tablet (25 mg total) by mouth daily. 90 tablet 3 Taking  . Magnesium 400 MG CAPS Take 400 mg by mouth daily.   Taking  . metoprolol succinate (TOPROL-XL) 25 MG 24 hr tablet Take 1 tablet (25 mg  total) by mouth daily. 30 tablet 3 Taking  . Multiple Vitamins-Minerals (MULTIVITAMIN WITH MINERALS) tablet Take 1 tablet by mouth daily.     Marland Kitchen OVER THE COUNTER MEDICATION Take 1 tablet by mouth 3 (three) times daily. ExtendoVite     . Potassium 99 MG TABS Take 99 mg by mouth daily.   Taking  . traMADol (ULTRAM) 50 MG tablet Take 1-2 tablets (50-100 mg total) by mouth every 6 (six) hours as needed for moderate pain. (Patient taking differently: Take 50-100 mg by mouth at bedtime. ) 40 tablet 0 Taking  . vitamin C (ASCORBIC ACID) 500 MG tablet Take 500 mg by mouth daily.       Allergies  Allergen Reactions  . Lisinopril Nausea Only    Social History   Tobacco Use  . Smoking status: Never Smoker  . Smokeless tobacco: Never Used  Substance Use Topics  . Alcohol use: Not Currently    Family History  Problem Relation Age of Onset  . Breast cancer Mother   . Cataracts Mother   . Stroke Father   . Atrial fibrillation Father   . Kidney disease Sister      ROS Constitutional: Constitutional: no fever, chills, night sweats, or significant weight loss. Cardiovascular: Cardiovascular: no palpitations or chest pain. Respiratory: Respiratory: no cough or shortness of breath and No COPD. Gastrointestinal: Gastrointestinal: no vomiting or nausea. Musculoskeletal: Musculoskeletal: no swelling in Joints and Joint Pain. Neurologic: Neurologic: no numbness, tingling, or difficulty with balance.  Objective:  Physical Exam Patient is a 53 year old female.  Well nourished and well developed. General: Alert and oriented x3, cooperative and pleasant, no acute distress. Head: normocephalic, atraumatic, neck supple. Eyes: EOMI. Respiratory: breath sounds clear in all fields, no wheezing, rales, or rhonchi. Cardiovascular: Regular rate and rhythm, no murmurs, gallops or rubs. Abdomen: non-tender to palpation and soft, normoactive bowel sounds.  Musculoskeletal: Left hip exam: No swelling. 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  Calves soft and nontender. Motor function intact in LE. Strength 5/5 LE bilaterally. Neuro: Distal pulses 2+. Sensation to light touch intact in LE. Vital signs in last 24 hours: BP: 110/65 mmHg  Labs:   Estimated body mass index is 18.94 kg/m as calculated from the following:   Height as of 04/10/19: 5' (1.524 m).   Weight as of 04/10/19: 44 kg.   Imaging Review Plain radiographs demonstrate severe  degenerative joint disease of the right hip(s). The bone quality appears to be adequate for age and reported activity level.      Assessment/Plan:  End stage arthritis, right hip(s)  The patient history, physical examination, clinical judgement of the provider and imaging studies are consistent with end stage degenerative joint disease of the right hip(s) and total hip arthroplasty is deemed medically necessary. The treatment options including medical management, injection therapy, arthroscopy and arthroplasty were discussed at length. The risks and benefits of total hip arthroplasty were presented and reviewed. The risks due to aseptic loosening, infection, stiffness, dislocation/subluxation,  thromboembolic complications and other imponderables were discussed.  The patient acknowledged the explanation, agreed to proceed with the plan and consent was signed. Patient is being admitted for inpatient treatment for surgery, pain control, PT, OT, prophylactic antibiotics, VTE prophylaxis, progressive ambulation and ADL's and discharge planning.The patient is planning to be discharged home.   Risks and benefits of the surgery were discussed with  the patient and Dr. Lequita Halt at their previous office visit, and the patient has elected to move forward with the aforementioned surgery. Post-operative care plans were discussed with the patient today and all patient questions were answered.  Therapy Plans: HEP Disposition: Home with husband and kids Planned DVT Prophylaxis: Xarelto 10mg  daily (Hx Aortic valve replacement) DME needed: none PCP: Dr. Leretha Pol Cardiologist: Dr. [facility]via TXA: IV Allergies: Lisinopril - nausea Anesthesia Concerns: none BMI: 20.3  Other: none  Instructed patient on which medications to discontinue 5 days prior to surgery. Will follow-up in office with Dr. Lequita Halt 2 weeks post-op.   - Patient was instructed on what medications to stop prior to surgery. - Follow-up visit  in 2 weeks with Dr. Lequita Halt - Begin physical therapy following surgery - Pre-operative lab work as pre-surgical testing - Prescriptions will be provided in hospital at time of discharge  Dennie Bible, PA-C Orthopedic Surgery [facility] [place]

## 2019-04-26 ENCOUNTER — Encounter (HOSPITAL_COMMUNITY): Payer: Self-pay

## 2019-04-26 NOTE — Pre-Procedure Instructions (Signed)
Last office visit note Dr. Alden Hipp 02/26/2019 in care everywhere  The following are in epic: Last office visit note Dr. [facility] Salvia 04/10/2019 Cardiac clearance Dr. [facility] Sal[facility] 12/20/2018 telephone note EKG 11/01/2018 EPS 09/01/2018

## 2019-04-26 NOTE — Progress Notes (Signed)
SPOKE W/  Michelle Lloyd     SCREENING SYMPTOMS OF COVID 19:   COUGH--NO  RUNNY NOSE--- NO  SORE THROAT---NO  NASAL CONGESTION----NO  SNEEZING----NO  SHORTNESS OF BREATH---NO  DIFFICULTY BREATHING---NO  TEMP >100.0 -----NO  UNEXPLAINED BODY ACHES------NO  CHILLS -------- NO  HEADACHES ---------NO  LOSS OF SMELL/ TASTE --------NO    HAVE YOU OR ANY FAMILY MEMBER TRAVELLED PAST 14 DAYS OUT OF THE   COUNTY---NO STATE----NO COUNTRY----NO  HAVE YOU OR ANY FAMILY MEMBER BEEN EXPOSED TO ANYONE WITH COVID 19? NO

## 2019-04-26 NOTE — Patient Instructions (Addendum)
DUE TO COVID-19 NO VISITORS ARE ALLOWED IN THE HOSPITAL AT THIS TIME   COVID SWAB TESTING MUST BE COMPLETED ON: Today, Apr 27, 2019 at 9:45AM   Your procedure is scheduled on: Wednesday, May 02, 2019   Surgery Time:  3:24PM-5:04PM   Report to [facility] Main  Entrance    Report to admitting at 12:45 PM   Call this number if you have problems the morning of surgery [phone]   Do not eat food:After Midnight.   May have liquids until 9:15AM day of surgery   CLEAR LIQUID DIET  Foods Allowed                                                                     Foods Excluded  Water, Black Coffee and tea, regular and decaf                             liquids that you cannot  Plain Jell-O in any flavor                                             see through such as: Fruit ices (not with fruit pulp)                                     milk, soups, orange juice  Iced Popsicles                                    All solid food Carbonated beverages, regular and diet                                    Cranberry, grape and apple juices Sports drinks like Gatorade Lightly seasoned clear broth or consume(fat free) Sugar, honey syrup  Sample Menu Breakfast                                Lunch                                     Supper Cranberry juice                    Beef broth                            Chicken broth Jell-O                                     Grape juice  Apple juice Coffee or tea                        Jell-O                                      Popsicle                                                Coffee or tea                        Coffee or tea   Complete one Ensure drink the morning of surgery at 4:30AM the day of surgery.   Brush your teeth the morning of surgery.   Do NOT smoke after Midnight   Take these medicines the morning of surgery with A SIP OF WATER: Metoprolol                               You may not have any  metal on your body including hair pins, jewelry, and body piercings             Do not wear make-up, lotions, powders, perfumes/cologne, or deodorant             Do not wear nail polish.  Do not shave  48 hours prior to surgery.               Do not bring valuables to the hospital. [facility] IS NOT             RESPONSIBLE   FOR VALUABLES.   Contacts, dentures or bridgework may not be worn into surgery.   Bring small overnight bag day of surgery.    Special Instructions: Bring a copy of your healthcare power of attorney and living will documents         the day of surgery if you haven't scanned them in before.              Please read over the following fact sheets you were given:  [facility] - Preparing for Surgery Before surgery, you can play an important role.  Because skin is not sterile, your skin needs to be as free of germs as possible.  You can reduce the number of germs on your skin by washing with CHG (chlorahexidine gluconate) soap before surgery.  CHG is an antiseptic cleaner which kills germs and bonds with the skin to continue killing germs even after washing. Please DO NOT use if you have an allergy to CHG or antibacterial soaps.  If your skin becomes reddened/irritated stop using the CHG and inform your nurse when you arrive at Short Stay. Do not shave (including legs and underarms) for at least 48 hours prior to the first CHG shower.  You may shave your face/neck.  Please follow these instructions carefully:  1.  Shower with CHG Soap the night before surgery and the  morning of surgery.  2.  If you choose to wash your hair, wash your hair first as usual with your normal  shampoo.  3.  After you shampoo, rinse your hair and body thoroughly to remove the shampoo.  4.  Use CHG as you would any other liquid soap.  You can apply chg directly to the skin and wash.  Gently with a scrungie or clean washcloth.  5.  Apply the CHG Soap to your body ONLY  FROM THE NECK DOWN.   Do   not use on face/ open                           Wound or open sores. Avoid contact with eyes, ears mouth and   genitals (private parts).                       Wash face,  Genitals (private parts) with your normal soap.             6.  Wash thoroughly, paying special attention to the area where your    surgery  will be performed.  7.  Thoroughly rinse your body with warm water from the neck down.  8.  DO NOT shower/wash with your normal soap after using and rinsing off the CHG Soap.                9.  Pat yourself dry with a clean towel.            10.  Wear clean pajamas.            11.  Place clean sheets on your bed the night of your first shower and do not  sleep with pets. Day of Surgery : Do not apply any lotions/deodorants the morning of surgery.  Please wear clean clothes to the hospital/surgery center.  FAILURE TO FOLLOW THESE INSTRUCTIONS MAY RESULT IN THE CANCELLATION OF YOUR SURGERY  PATIENT SIGNATURE_________________________________  NURSE SIGNATURE__________________________________  ________________________________________________________________________   Michelle Lloyd  An incentive spirometer is a tool that can help keep your lungs clear and active. This tool measures how well you are filling your lungs with each breath. Taking long deep breaths may help reverse or decrease the chance of developing breathing (pulmonary) problems (especially infection) following:  A long period of time when you are unable to move or be active. BEFORE THE PROCEDURE   If the spirometer includes an indicator to show your best effort, your nurse or respiratory therapist will set it to a desired goal.  If possible, sit up straight or lean slightly forward. Try not to slouch.  Hold the incentive spirometer in an upright position. INSTRUCTIONS FOR USE  1. Sit on the edge of your bed if possible, or sit up as far as you can in bed or on a chair. 2. Hold the  incentive spirometer in an upright position. 3. Breathe out normally. 4. Place the mouthpiece in your mouth and seal your lips tightly around it. 5. Breathe in slowly and as deeply as possible, raising the piston or the ball toward the top of the column. 6. Hold your breath for 3-5 seconds or for as long as possible. Allow the piston or ball to fall to the bottom of the column. 7. Remove the mouthpiece from your mouth and breathe out normally. 8. Rest for a few seconds and repeat Steps 1 through 7 at least 10 times every 1-2 hours when you are awake. Take your time and take a few normal breaths between deep breaths. 9. The spirometer may include an indicator to show your best effort. Use the indicator as a goal to work toward during each  repetition. 10. After each set of 10 deep breaths, practice coughing to be sure your lungs are clear. If you have an incision (the cut made at the time of surgery), support your incision when coughing by placing a pillow or rolled up towels firmly against it. Once you are able to get out of bed, walk around indoors and cough well. You may stop using the incentive spirometer when instructed by your caregiver.  RISKS AND COMPLICATIONS  Take your time so you do not get dizzy or light-headed.  If you are in pain, you may need to take or ask for pain medication before doing incentive spirometry. It is harder to take a deep breath if you are having pain. AFTER USE  Rest and breathe slowly and easily.  It can be helpful to keep track of a log of your progress. Your caregiver can provide you with a simple table to help with this. If you are using the spirometer at home, follow these instructions: [place] IF:   You are having difficultly using the spirometer.  You have trouble using the spirometer as often as instructed.  Your pain medication is not giving enough relief while using the spirometer.  You develop fever of 100.5 F (38.1 C) or  higher. SEEK IMMEDIATE MEDICAL CARE IF:   You cough up bloody sputum that had not been present before.  You develop fever of 102 F (38.9 C) or greater.  You develop worsening pain at or near the incision site. MAKE SURE YOU:   Understand these instructions.  Will watch your condition.  Will get help right away if you are not doing well or get worse. Document Released: 04/04/2007 Document Revised: 02/14/2012 Document Reviewed: 06/05/2007 Exit[employer] Patient Information 2014 [employer], [place].   ________________________________________________________________________  WHAT IS A BLOOD TRANSFUSION? Blood Transfusion Information  A transfusion is the replacement of blood or some of its parts. Blood is made up of multiple cells which provide different functions.  Red blood cells carry oxygen and are used for blood loss replacement.  White blood cells fight against infection.  Platelets control bleeding.  Plasma helps clot blood.  Other blood products are available for specialized needs, such as hemophilia or other clotting disorders. BEFORE THE TRANSFUSION  Who gives blood for transfusions?   Healthy volunteers who are fully evaluated to make sure their blood is safe. This is blood bank blood. Transfusion therapy is the safest it has ever been in the practice of medicine. Before blood is taken from a donor, a complete history is taken to make sure that person has no history of diseases nor engages in risky social behavior (examples are intravenous drug use or sexual activity with multiple partners). The donor's travel history is screened to minimize risk of transmitting infections, such as malaria. The donated blood is tested for signs of infectious diseases, such as HIV and hepatitis. The blood is then tested to be sure it is compatible with you in order to minimize the chance of a transfusion reaction. If you or a relative donates blood, this is often done in anticipation of surgery  and is not appropriate for emergency situations. It takes many days to process the donated blood. RISKS AND COMPLICATIONS Although transfusion therapy is very safe and saves many lives, the main dangers of transfusion include:   Getting an infectious disease.  Developing a transfusion reaction. This is an allergic reaction to something in the blood you were given. Every precaution is taken to prevent this.  The decision to have a blood transfusion has been considered carefully by your caregiver before blood is given. Blood is not given unless the benefits outweigh the risks. AFTER THE TRANSFUSION  Right after receiving a blood transfusion, you will usually feel much better and more energetic. This is especially true if your red blood cells have gotten low (anemic). The transfusion raises the level of the red blood cells which carry oxygen, and this usually causes an energy increase.  The nurse administering the transfusion will monitor you carefully for complications. HOME CARE INSTRUCTIONS  No special instructions are needed after a transfusion. You may find your energy is better. Speak with your caregiver about any limitations on activity for underlying diseases you may have. SEEK MEDICAL CARE IF:   Your condition is not improving after your transfusion.  You develop redness or irritation at the intravenous (IV) site. SEEK IMMEDIATE MEDICAL CARE IF:  Any of the following symptoms occur over the next 12 hours:  Shaking chills.  You have a temperature by mouth above 102 F (38.9 C), not controlled by medicine.  Chest, back, or muscle pain.  People around you feel you are not acting correctly or are confused.  Shortness of breath or difficulty breathing.  Dizziness and fainting.  You get a rash or develop hives.  You have a decrease in urine output.  Your urine turns a dark color or changes to pink, red, or brown. Any of the following symptoms occur over the next 10  days:  You have a temperature by mouth above 102 F (38.9 C), not controlled by medicine.  Shortness of breath.  Weakness after normal activity.  The white part of the eye turns yellow (jaundice).  You have a decrease in the amount of urine or are urinating less often.  Your urine turns a dark color or changes to pink, red, or brown. Document Released: 11/19/2000 Document Revised: 02/14/2012 Document Reviewed: 07/08/2008 [facility] Patient Information 2014 [place], [place].  _______________________________________________________________________

## 2019-04-27 ENCOUNTER — Other Ambulatory Visit (HOSPITAL_COMMUNITY)
Admission: RE | Admit: 2019-04-27 | Discharge: 2019-04-27 | Disposition: A | Discharge status: Home / Self Care | Payer: Managed Care, Other (non HMO) | Source: Ambulatory Visit | Attending: Orthopedic Surgery | Admitting: Orthopedic Surgery

## 2019-04-27 ENCOUNTER — Other Ambulatory Visit: Payer: Self-pay

## 2019-04-27 ENCOUNTER — Encounter (HOSPITAL_COMMUNITY): Payer: Self-pay

## 2019-04-27 ENCOUNTER — Encounter (HOSPITAL_COMMUNITY)
Admission: RE | Admit: 2019-04-27 | Discharge: 2019-04-27 | Disposition: A | Discharge status: Home / Self Care | Payer: Managed Care, Other (non HMO) | Source: Ambulatory Visit | Attending: Orthopedic Surgery | Admitting: Orthopedic Surgery

## 2019-04-27 DIAGNOSIS — I110 Hypertensive heart disease with heart failure: Secondary | ICD-10-CM | POA: Diagnosis not present

## 2019-04-27 DIAGNOSIS — Q969 Turner's syndrome, unspecified: Secondary | ICD-10-CM | POA: Diagnosis not present

## 2019-04-27 DIAGNOSIS — Z96642 Presence of left artificial hip joint: Secondary | ICD-10-CM | POA: Diagnosis not present

## 2019-04-27 DIAGNOSIS — I509 Heart failure, unspecified: Secondary | ICD-10-CM | POA: Diagnosis not present

## 2019-04-27 DIAGNOSIS — Z953 Presence of xenogenic heart valve: Secondary | ICD-10-CM | POA: Insufficient documentation

## 2019-04-27 DIAGNOSIS — M1611 Unilateral primary osteoarthritis, right hip: Secondary | ICD-10-CM | POA: Diagnosis not present

## 2019-04-27 DIAGNOSIS — Z8674 Personal history of sudden cardiac arrest: Secondary | ICD-10-CM | POA: Diagnosis not present

## 2019-04-27 DIAGNOSIS — E785 Hyperlipidemia, unspecified: Secondary | ICD-10-CM | POA: Diagnosis not present

## 2019-04-27 DIAGNOSIS — Z1159 Encounter for screening for other viral diseases: Secondary | ICD-10-CM | POA: Diagnosis not present

## 2019-04-27 DIAGNOSIS — I428 Other cardiomyopathies: Secondary | ICD-10-CM | POA: Insufficient documentation

## 2019-04-27 DIAGNOSIS — Z95810 Presence of automatic (implantable) cardiac defibrillator: Secondary | ICD-10-CM | POA: Diagnosis not present

## 2019-04-27 DIAGNOSIS — Z01818 Encounter for other preprocedural examination: Secondary | ICD-10-CM | POA: Diagnosis not present

## 2019-04-27 DIAGNOSIS — Z79899 Other long term (current) drug therapy: Secondary | ICD-10-CM | POA: Diagnosis not present

## 2019-04-27 DIAGNOSIS — Z7982 Long term (current) use of aspirin: Secondary | ICD-10-CM | POA: Diagnosis not present

## 2019-04-27 HISTORY — DX: Pain in right wrist: M25.531

## 2019-04-27 HISTORY — DX: Personal history of other diseases of the respiratory system: Z87.09

## 2019-04-27 HISTORY — DX: Atrioventricular block, complete: I44.2

## 2019-04-27 HISTORY — DX: Presence of external hearing-aid: Z97.4

## 2019-04-27 HISTORY — DX: Other specified postprocedural states: R11.2

## 2019-04-27 HISTORY — DX: Personal history of other diseases of the circulatory system: Z86.79

## 2019-04-27 HISTORY — DX: Essential (primary) hypertension: I10

## 2019-04-27 HISTORY — DX: Nausea with vomiting, unspecified: Z98.890

## 2019-04-27 LAB — CBC
HCT: 33.5 % — ABNORMAL LOW (ref 36.0–46.0)
Hemoglobin: 10.4 g/dL — ABNORMAL LOW (ref 12.0–15.0)
MCH: 29.4 pg (ref 26.0–34.0)
MCHC: 31 g/dL (ref 30.0–36.0)
MCV: 94.6 fL (ref 80.0–100.0)
Platelets: 241 10*3/uL (ref 150–400)
RBC: 3.54 MIL/uL — ABNORMAL LOW (ref 3.87–5.11)
RDW: 14.3 % (ref 11.5–15.5)
WBC: 6.7 10*3/uL (ref 4.0–10.5)
nRBC: 0 % (ref 0.0–0.2)

## 2019-04-27 LAB — COMPREHENSIVE METABOLIC PANEL
ALT: 18 U/L (ref 0–44)
AST: 21 U/L (ref 15–41)
Albumin: 3.7 g/dL (ref 3.5–5.0)
Alkaline Phosphatase: 110 U/L (ref 38–126)
Anion gap: 10 (ref 5–15)
BUN: 18 mg/dL (ref 6–20)
CO2: 26 mmol/L (ref 22–32)
Calcium: 9.1 mg/dL (ref 8.9–10.3)
Chloride: 102 mmol/L (ref 98–111)
Creatinine, Ser: 0.58 mg/dL (ref 0.44–1.00)
GFR calc Af Amer: >60 mL/min (ref >60)
GFR calc non Af Amer: >60 mL/min (ref >60)
Glucose, Bld: 91 mg/dL (ref 70–99)
Potassium: 4.2 mmol/L (ref 3.5–5.1)
Sodium: 138 mmol/L (ref 135–145)
Total Bilirubin: 0.3 mg/dL (ref 0.3–1.2)
Total Protein: 7.3 g/dL (ref 6.5–8.1)

## 2019-04-27 LAB — SURGICAL PCR SCREEN
MRSA, PCR: NEGATIVE
Staphylococcus aureus: POSITIVE — AB

## 2019-04-27 LAB — PROTIME-INR
INR: 1 (ref 0.8–1.2)
Prothrombin Time: 12.8 seconds (ref 11.4–15.2)

## 2019-04-27 LAB — APTT: aPTT: 33 seconds (ref 24–36)

## 2019-04-28 LAB — NOVEL CORONAVIRUS, NAA (HOSP ORDER, SEND-OUT TO REF LAB; TAT 18-24 HRS): SARS-CoV-2, NAA: NOT DETECTED

## 2019-05-01 NOTE — Progress Notes (Signed)
SPOKE W/  _patient     SCREENING SYMPTOMS OF COVID 19:   COUGH--no  RUNNY NOSE--- no  SORE THROAT---no  NASAL CONGESTION----no  SNEEZING----no  SHORTNESS OF BREATH---no  DIFFICULTY BREATHING---no  TEMP >100.0 -----no  UNEXPLAINED BODY ACHES------no  CHILLS -------- no  HEADACHES ---------no  LOSS OF SMELL/ TASTE --------no    HAVE YOU OR ANY FAMILY MEMBER TRAVELLED PAST 14 DAYS OUT OF THE   COUNTY---no STATE----no COUNTRY----njo  HAVE YOU OR ANY FAMILY MEMBER BEEN EXPOSED TO ANYONE WITH COVID 19? no

## 2019-05-01 NOTE — Progress Notes (Signed)
Anesthesia Chart Review   Case:  [id number] Date/Time:  05/02/19 1509   Procedure:  TOTAL HIP ARTHROPLASTY ANTERIOR APPROACH (Right ) -   Anesthesia type:  Choice   Pre-op diagnosis:  right hip osteoarthritis   Location:  [facility] ROOM 09 / [facility]   Surgeon:  Ollen Gross, MD      DISCUSSION: 53 yo never smoker with h/o PONV, Turner syndrome, AV bioprosthetic replacement (3.7 m/s peak vel 54 mmHg peak grad37 mmHg mean grad on 06/02/18 echo), BIVICD, CHF, HLD, non-ischemic cardiomyopathy, HTN, anemia, reports hard to wake with previous anesthesia, right hip OA scheduled for above procedure 05/02/19 with Dr. Ollen Gross.   Cardiac clearance received 12/20/2018.  Per Manson Passey, PA-C, "Patient was last seen by Dr. Duke Salvia 11/01/18 and cleared as "Upon evaluation today, shecan achieve 4 METs or greater without anginal symptoms. According to [facility] and AHA guidelines, sherequires no further cardiac workup prior to hernoncardiac surgery and should be at acceptable risk. herNSQIP risk of peri-procedural MI or cardiac arrest is 0.02%. Our service is available as necessary in the perioperative period".  During my conversation today she did not have any cardiac issue. Still able to get at least 4 mets of activity. However she is experiencing a tingling sensation on the right side of her body. Dr. Duke Salvia has recommended (I agree) evaluation with PCP or neurology.  In regards to cardiac clearance, given past medical history and time since last visit, based on ACC/AHA guidelines, Shauntia Franzone would be at acceptable risk for the planned procedure without further cardiovascular testing." Seen by Dr. Chilton Si 04/10/19, stable at this visit with upcoming echo scheduled 06/2019.    S/p Left total hip arthroplasty 11/27/18 with no anesthesia complications noted.   Pt can proceed with planned procedure barring acute status change.  VS: BP 102/64   Pulse 75   Temp 37.5 C (Oral)    Resp 16   Ht 5' (1.524 m)   Wt 50.8 kg   LMP  (LMP Unknown)   SpO2 99%   BMI 21.87 kg/m   PROVIDERS: Myles Lipps, MD is PCP   Chilton Si, MD is Cardiologist   Daubert, Fayrene Fearing, MD is Electrophysiologist LABS: Labs reviewed: Acceptable for surgery. (all labs ordered are listed, but only abnormal results are displayed)  Labs Reviewed  SURGICAL PCR SCREEN - Abnormal; Notable for the following components:      Result Value   Staphylococcus aureus POSITIVE (*)    All other components within normal limits  CBC - Abnormal; Notable for the following components:   RBC 3.54 (*)    Hemoglobin 10.4 (*)    HCT 33.5 (*)    All other components within normal limits  APTT  COMPREHENSIVE METABOLIC PANEL  PROTIME-INR  TYPE AND SCREEN     IMAGES:   EKG: 11/01/18 Rate 80 bpm Atrial-sensed ventricular-paced rhythm with prolonged AV conduction Biventricular pacemaker detected  CV: Echo 06/02/2018 INTERPRETATION ---------------------------------------------------------------   SEVERE LV DYSFUNCTION (See above) WITH MILD LVH   NORMAL RIGHT VENTRICULAR SYSTOLIC FUNCTION   VALVULAR REGURGITATION: TRIVIAL MR, MILD TR   PROSTHETIC VALVE(S): BIOPROSTHETIC AoV  Aortic: No ARBIOPROSTHETIC AoV 3.7 m/s peak vel 54 mmHg peak grad37 mmHg mean grad   Compared with prior Echo study on 10/06/2017: NO SIGNIFICANT CHANGES. Past Medical History:  Diagnosis Date  . Allergy   . Anemia   . Bilateral primary osteoarthritis of hip   . Bilateral wrist pain   . Blood transfusion without  reported diagnosis   . Cardiac device in situ 07/06/2018   [place]ron[place] BIVICD , right chest  . CHB (complete heart block) (HCC)    History of   . CHF (congestive heart failure) (HCC)   . Complication of anesthesia    hard to wake up at times in past  . H/O prosthetic aortic valve replacement   . Heart murmur   . History of bronchitis   . History of cardiac arrest 01/2017  .  History of repair of coarctation of aorta   . History of sustained ventricular tachycardia   . History of ventricular fibrillation 01/2017  . Hyperlipidemia   . Hypertension   . Non-ischemic cardiomyopathy (HCC)   . Pneumonia   . PONV (postoperative nausea and vomiting)   . Presence of permanent cardiac pacemaker    right chest  . Systolic heart failure (HCC)   . Turner syndrome   . Wears hearing aid in both ears     Past Surgical History:  Procedure Laterality Date  . CARDIAC ASSIST DEVICE REMOVAL     device got infected  . DENTAL SURGERY    . EYE SURGERY     Left eye lazy eye, childhood  . PACEMAKER INSERTION     located on right side  . TONSILLECTOMY    . TOTAL HIP ARTHROPLASTY Left 11/27/2018   Procedure: TOTAL HIP ARTHROPLASTY ANTERIOR APPROACH;  Surgeon: Ollen Gross, MD;  Location: [facility];  Service: Orthopedics;  Laterality: Left;     MEDICATIONS: . acetaminophen (TYLENOL) 650 MG CR tablet  . aspirin 81 MG chewable tablet  . Coenzyme Q10 (CO Q 10 PO)  . ferrous sulfate 325 (65 FE) MG tablet  . furosemide (LASIX) 20 MG tablet  . losartan (COZAAR) 25 MG tablet  . Magnesium 400 MG CAPS  . metoprolol succinate (TOPROL-XL) 25 MG 24 hr tablet  . Multiple Vitamins-Minerals (MULTIVITAMIN WITH MINERALS) tablet  . OVER THE COUNTER MEDICATION  . Potassium 99 MG TABS  . traMADol (ULTRAM) 50 MG tablet  . vitamin C (ASCORBIC ACID) 500 MG tablet   No current facility-administered medications for this encounter.     Janey Genta [facility] Pre-Surgical Testing [phone] 05/01/19 12:00 PM

## 2019-05-01 NOTE — Anesthesia Preprocedure Evaluation (Addendum)
Anesthesia Evaluation    Reviewed: Allergy & Precautions, Patient's Chart, lab work & pertinent test results  History of Anesthesia Complications Negative for: history of anesthetic complications  Airway Mallampati: II  TM Distance: >3 FB Neck ROM: Full    Dental no notable dental hx. (+) Dental Advisory Given, Poor Dentition, Chipped   Pulmonary neg pulmonary ROS,    Pulmonary exam normal        Cardiovascular METS (>4): hypertension, +CHF (EF 30%)  Normal cardiovascular exam+ pacemaker (BiV w/ ICD) + Cardiac Defibrillator + Valvular Problems/Murmurs (s/p AVR) AS  Rhythm:Regular Rate:Normal     Neuro/Psych negative neurological ROS  negative psych ROS   GI/Hepatic negative GI ROS, Neg liver ROS,   Endo/Other  negative endocrine ROS  Renal/GU negative Renal ROS  negative genitourinary   Musculoskeletal negative musculoskeletal ROS (+)   Abdominal   Peds  Hematology negative hematology ROS (+)   Anesthesia Other Findings Echo 06/02/2018: SEVERE LV DYSFUNCTION (EF 30%) WITH MILD LVH, NORMAL RIGHT VENTRICULAR SYSTOLIC FUNCTION, TRIVIAL MR, MILD TR, BIOPROSTHETIC AoV, No AR, 3.7 m/s peak vel, 54 mmHg peak grad, 37 mmHg mean grad  Reproductive/Obstetrics                           Anesthesia Physical Anesthesia Plan  ASA: III  Anesthesia Plan: General   Post-op Pain Management:    Induction: Intravenous  PONV Risk Score and Plan: 3 and Ondansetron, Dexamethasone, Midazolam and Treatment may vary due to age or medical condition  Airway Management Planned: Oral ETT  Additional Equipment: None  Intra-op Plan:   Post-operative Plan: Extubation in OR  Informed Consent: I have reviewed the patients History and Physical, chart, labs and discussed the procedure including the risks, benefits and alternatives for the proposed anesthesia with the patient or authorized representative who has  indicated his/her understanding and acceptance.     Dental advisory given  Plan Discussed with:   Anesthesia Plan Comments: (See PAT note 04/27/2019, Jodell Cipro, PA-C)       Anesthesia Quick Evaluation

## 2019-05-02 ENCOUNTER — Inpatient Hospital Stay (HOSPITAL_COMMUNITY): Payer: Managed Care, Other (non HMO)

## 2019-05-02 ENCOUNTER — Inpatient Hospital Stay (HOSPITAL_COMMUNITY)
Admission: RE | Admit: 2019-05-02 | Discharge: 2019-05-04 | DRG: 470 | Disposition: A | Discharge status: Home / Self Care | Payer: Managed Care, Other (non HMO) | Attending: Orthopedic Surgery | Admitting: Orthopedic Surgery

## 2019-05-02 ENCOUNTER — Other Ambulatory Visit: Payer: Self-pay

## 2019-05-02 ENCOUNTER — Inpatient Hospital Stay (HOSPITAL_COMMUNITY): Payer: Managed Care, Other (non HMO) | Admitting: Physician Assistant

## 2019-05-02 ENCOUNTER — Telehealth (HOSPITAL_COMMUNITY): Payer: Self-pay | Admitting: *Deleted

## 2019-05-02 ENCOUNTER — Encounter (HOSPITAL_COMMUNITY)
Admission: RE | Disposition: A | Discharge status: Home / Self Care | Payer: Self-pay | Source: Home / Self Care | Attending: Orthopedic Surgery

## 2019-05-02 ENCOUNTER — Inpatient Hospital Stay (HOSPITAL_COMMUNITY): Payer: Managed Care, Other (non HMO) | Admitting: Certified Registered"

## 2019-05-02 ENCOUNTER — Encounter (HOSPITAL_COMMUNITY): Payer: Self-pay | Admitting: *Deleted

## 2019-05-02 DIAGNOSIS — Z79891 Long term (current) use of opiate analgesic: Secondary | ICD-10-CM

## 2019-05-02 DIAGNOSIS — Z841 Family history of disorders of kidney and ureter: Secondary | ICD-10-CM | POA: Diagnosis not present

## 2019-05-02 DIAGNOSIS — M169 Osteoarthritis of hip, unspecified: Secondary | ICD-10-CM | POA: Diagnosis present

## 2019-05-02 DIAGNOSIS — Z96649 Presence of unspecified artificial hip joint: Secondary | ICD-10-CM

## 2019-05-02 DIAGNOSIS — Z8674 Personal history of sudden cardiac arrest: Secondary | ICD-10-CM

## 2019-05-02 DIAGNOSIS — Z7982 Long term (current) use of aspirin: Secondary | ICD-10-CM | POA: Diagnosis not present

## 2019-05-02 DIAGNOSIS — Z974 Presence of external hearing-aid: Secondary | ICD-10-CM | POA: Diagnosis not present

## 2019-05-02 DIAGNOSIS — I5022 Chronic systolic (congestive) heart failure: Secondary | ICD-10-CM | POA: Diagnosis present

## 2019-05-02 DIAGNOSIS — Z950 Presence of cardiac pacemaker: Secondary | ICD-10-CM | POA: Diagnosis not present

## 2019-05-02 DIAGNOSIS — Z952 Presence of prosthetic heart valve: Secondary | ICD-10-CM | POA: Diagnosis not present

## 2019-05-02 DIAGNOSIS — Z8679 Personal history of other diseases of the circulatory system: Secondary | ICD-10-CM | POA: Diagnosis not present

## 2019-05-02 DIAGNOSIS — Z888 Allergy status to other drugs, medicaments and biological substances status: Secondary | ICD-10-CM | POA: Diagnosis not present

## 2019-05-02 DIAGNOSIS — Z803 Family history of malignant neoplasm of breast: Secondary | ICD-10-CM | POA: Diagnosis not present

## 2019-05-02 DIAGNOSIS — M1611 Unilateral primary osteoarthritis, right hip: Secondary | ICD-10-CM

## 2019-05-02 DIAGNOSIS — Z96642 Presence of left artificial hip joint: Secondary | ICD-10-CM | POA: Diagnosis present

## 2019-05-02 DIAGNOSIS — Z8774 Personal history of (corrected) congenital malformations of heart and circulatory system: Secondary | ICD-10-CM | POA: Diagnosis not present

## 2019-05-02 DIAGNOSIS — E785 Hyperlipidemia, unspecified: Secondary | ICD-10-CM | POA: Diagnosis present

## 2019-05-02 DIAGNOSIS — E876 Hypokalemia: Secondary | ICD-10-CM | POA: Diagnosis not present

## 2019-05-02 DIAGNOSIS — Z79899 Other long term (current) drug therapy: Secondary | ICD-10-CM | POA: Diagnosis not present

## 2019-05-02 DIAGNOSIS — Z419 Encounter for procedure for purposes other than remedying health state, unspecified: Secondary | ICD-10-CM

## 2019-05-02 DIAGNOSIS — I110 Hypertensive heart disease with heart failure: Secondary | ICD-10-CM | POA: Diagnosis present

## 2019-05-02 DIAGNOSIS — I428 Other cardiomyopathies: Secondary | ICD-10-CM | POA: Diagnosis present

## 2019-05-02 DIAGNOSIS — Z823 Family history of stroke: Secondary | ICD-10-CM

## 2019-05-02 HISTORY — PX: TOTAL HIP ARTHROPLASTY: SHX124

## 2019-05-02 LAB — TYPE AND SCREEN
ABO/RH(D): AB POS
Antibody Screen: NEGATIVE

## 2019-05-02 LAB — POCT I-STAT 7, (LYTES, BLD GAS, ICA,H+H)
Acid-Base Excess: 3 mmol/L — ABNORMAL HIGH (ref 0.0–2.0)
Bicarbonate: 27.9 mmol/L (ref 20.0–28.0)
Calcium, Ion: 1.14 mmol/L — ABNORMAL LOW (ref 1.15–1.40)
HCT: 26 % — ABNORMAL LOW (ref 36.0–46.0)
Hemoglobin: 8.8 g/dL — ABNORMAL LOW (ref 12.0–15.0)
O2 Saturation: 100 %
Patient temperature: 36.8
Potassium: 3.8 mmol/L (ref 3.5–5.1)
Sodium: 136 mmol/L (ref 135–145)
TCO2: 29 mmol/L (ref 22–32)
pCO2 arterial: 41.3 mmHg (ref 32.0–48.0)
pH, Arterial: 7.437 (ref 7.350–7.450)
pO2, Arterial: 276 mmHg — ABNORMAL HIGH (ref 83.0–108.0)

## 2019-05-02 SURGERY — ARTHROPLASTY, HIP, TOTAL, ANTERIOR APPROACH
Anesthesia: General | Site: Hip | Laterality: Right

## 2019-05-02 MED ORDER — ONDANSETRON HCL 4 MG PO TABS: 4.0000 mg | ORAL_TABLET | Freq: Four times a day (QID) | ORAL | Status: DC | PRN

## 2019-05-02 MED ORDER — OXYCODONE HCL 5 MG PO TABS: 5.0000 mg | ORAL_TABLET | Freq: Once | ORAL | Status: DC | PRN

## 2019-05-02 MED ORDER — METOCLOPRAMIDE HCL 5 MG/ML IJ SOLN: 5.0000 mg | Freq: Three times a day (TID) | INTRAMUSCULAR | Status: DC | PRN

## 2019-05-02 MED ORDER — SUGAMMADEX SODIUM 200 MG/2ML IV SOLN
INTRAVENOUS | Status: DC | PRN
  Administered 2019-05-02: 150 mg via INTRAVENOUS

## 2019-05-02 MED ORDER — DEXAMETHASONE SODIUM PHOSPHATE 10 MG/ML IJ SOLN
INTRAMUSCULAR | Status: DC | PRN
  Administered 2019-05-02: 4 mg via INTRAVENOUS

## 2019-05-02 MED ORDER — SODIUM CHLORIDE 0.9 % IV SOLN
INTRAVENOUS | Status: DC | PRN
  Administered 2019-05-02: 14:00:00 25 ug/min via INTRAVENOUS

## 2019-05-02 MED ORDER — SUCCINYLCHOLINE CHLORIDE 200 MG/10ML IV SOSY
PREFILLED_SYRINGE | INTRAVENOUS | Status: DC | PRN
  Administered 2019-05-02: 60 mg via INTRAVENOUS

## 2019-05-02 MED ORDER — CEFAZOLIN SODIUM-DEXTROSE 1-4 GM/50ML-% IV SOLN
1.0000 g | Freq: Four times a day (QID) | INTRAVENOUS | Status: AC
  Administered 2019-05-02 – 2019-05-03 (×2): 1 g via INTRAVENOUS
  Filled 2019-05-02 (×2): qty 50

## 2019-05-02 MED ORDER — ROCURONIUM BROMIDE 10 MG/ML (PF) SYRINGE
PREFILLED_SYRINGE | INTRAVENOUS | Status: AC
  Filled 2019-05-02: qty 20

## 2019-05-02 MED ORDER — METHOCARBAMOL 500 MG IVPB - SIMPLE MED
INTRAVENOUS | Status: AC
  Filled 2019-05-02: qty 50

## 2019-05-02 MED ORDER — METHOCARBAMOL 500 MG PO TABS
500.0000 mg | ORAL_TABLET | Freq: Four times a day (QID) | ORAL | Status: DC | PRN
  Administered 2019-05-04: 500 mg via ORAL
  Filled 2019-05-02: qty 1

## 2019-05-02 MED ORDER — FENTANYL CITRATE (PF) 250 MCG/5ML IJ SOLN
INTRAMUSCULAR | Status: DC | PRN
  Administered 2019-05-02 (×5): 50 ug via INTRAVENOUS

## 2019-05-02 MED ORDER — FERROUS SULFATE 325 (65 FE) MG PO TABS
325.0000 mg | ORAL_TABLET | Freq: Every day | ORAL | Status: DC
  Administered 2019-05-03 – 2019-05-04 (×2): 325 mg via ORAL
  Filled 2019-05-02 (×2): qty 1

## 2019-05-02 MED ORDER — ONDANSETRON HCL 4 MG/2ML IJ SOLN
INTRAMUSCULAR | Status: AC
  Filled 2019-05-02: qty 6

## 2019-05-02 MED ORDER — RIVAROXABAN 10 MG PO TABS
10.0000 mg | ORAL_TABLET | Freq: Every day | ORAL | Status: DC
  Administered 2019-05-03 – 2019-05-04 (×2): 10 mg via ORAL
  Filled 2019-05-02 (×2): qty 1

## 2019-05-02 MED ORDER — BISACODYL 10 MG RE SUPP: 10.0000 mg | Freq: Every day | RECTAL | Status: DC | PRN

## 2019-05-02 MED ORDER — LOSARTAN POTASSIUM 25 MG PO TABS
25.0000 mg | ORAL_TABLET | Freq: Every day | ORAL | Status: DC
  Administered 2019-05-03 – 2019-05-04 (×2): 25 mg via ORAL
  Filled 2019-05-02 (×2): qty 1

## 2019-05-02 MED ORDER — POVIDONE-IODINE 10 % EX SWAB
2.0000 "application " | Freq: Once | CUTANEOUS | Status: AC
  Administered 2019-05-02: 2 via TOPICAL

## 2019-05-02 MED ORDER — DIPHENHYDRAMINE HCL 12.5 MG/5ML PO ELIX: 12.5000 mg | ORAL_SOLUTION | ORAL | Status: DC | PRN

## 2019-05-02 MED ORDER — PROPOFOL 10 MG/ML IV BOLUS
INTRAVENOUS | Status: DC | PRN
  Administered 2019-05-02: 70 mg via INTRAVENOUS

## 2019-05-02 MED ORDER — OXYCODONE HCL 5 MG/5ML PO SOLN: 5.0000 mg | Freq: Once | ORAL | Status: DC | PRN

## 2019-05-02 MED ORDER — MIDAZOLAM HCL 2 MG/2ML IJ SOLN
INTRAMUSCULAR | Status: AC
  Filled 2019-05-02: qty 2

## 2019-05-02 MED ORDER — CHLORHEXIDINE GLUCONATE 4 % EX LIQD: 60.0000 mL | Freq: Once | CUTANEOUS | Status: DC

## 2019-05-02 MED ORDER — TRANEXAMIC ACID-NACL 1000-0.7 MG/100ML-% IV SOLN
1000.0000 mg | INTRAVENOUS | Status: AC
  Administered 2019-05-02: 1000 mg via INTRAVENOUS
  Filled 2019-05-02: qty 100

## 2019-05-02 MED ORDER — ONDANSETRON HCL 4 MG/2ML IJ SOLN: 4.0000 mg | Freq: Once | INTRAMUSCULAR | Status: DC | PRN

## 2019-05-02 MED ORDER — CEFAZOLIN SODIUM-DEXTROSE 2-4 GM/100ML-% IV SOLN
2.0000 g | INTRAVENOUS | Status: AC
  Administered 2019-05-02: 2 g via INTRAVENOUS
  Filled 2019-05-02: qty 100

## 2019-05-02 MED ORDER — LIDOCAINE 2% (20 MG/ML) 5 ML SYRINGE
INTRAMUSCULAR | Status: AC
  Filled 2019-05-02: qty 15

## 2019-05-02 MED ORDER — FUROSEMIDE 20 MG PO TABS
20.0000 mg | ORAL_TABLET | Freq: Every day | ORAL | Status: DC
  Administered 2019-05-04: 20 mg via ORAL
  Filled 2019-05-02 (×2): qty 1

## 2019-05-02 MED ORDER — DEXAMETHASONE SODIUM PHOSPHATE 10 MG/ML IJ SOLN
INTRAMUSCULAR | Status: AC
  Filled 2019-05-02: qty 3

## 2019-05-02 MED ORDER — LIDOCAINE 2% (20 MG/ML) 5 ML SYRINGE
INTRAMUSCULAR | Status: DC | PRN
  Administered 2019-05-02 (×2): 40 mg via INTRAVENOUS

## 2019-05-02 MED ORDER — FENTANYL CITRATE (PF) 100 MCG/2ML IJ SOLN
25.0000 ug | INTRAMUSCULAR | Status: DC | PRN
  Administered 2019-05-02 (×3): 50 ug via INTRAVENOUS

## 2019-05-02 MED ORDER — LACTATED RINGERS IV SOLN
INTRAVENOUS | Status: DC
  Administered 2019-05-02 (×2): via INTRAVENOUS

## 2019-05-02 MED ORDER — METOPROLOL SUCCINATE ER 25 MG PO TB24
25.0000 mg | ORAL_TABLET | Freq: Every day | ORAL | Status: DC
  Administered 2019-05-03 – 2019-05-04 (×2): 25 mg via ORAL
  Filled 2019-05-02 (×2): qty 1

## 2019-05-02 MED ORDER — SUGAMMADEX SODIUM 200 MG/2ML IV SOLN
INTRAVENOUS | Status: AC
  Filled 2019-05-02: qty 2

## 2019-05-02 MED ORDER — FENTANYL CITRATE (PF) 250 MCG/5ML IJ SOLN
INTRAMUSCULAR | Status: AC
  Filled 2019-05-02: qty 5

## 2019-05-02 MED ORDER — ACETAMINOPHEN 500 MG PO TABS
500.0000 mg | ORAL_TABLET | Freq: Four times a day (QID) | ORAL | Status: AC
  Administered 2019-05-02 – 2019-05-03 (×4): 500 mg via ORAL
  Filled 2019-05-02 (×4): qty 1

## 2019-05-02 MED ORDER — METHOCARBAMOL 500 MG IVPB - SIMPLE MED
500.0000 mg | Freq: Four times a day (QID) | INTRAVENOUS | Status: DC | PRN
  Administered 2019-05-02: 500 mg via INTRAVENOUS
  Filled 2019-05-02: qty 50

## 2019-05-02 MED ORDER — BUPIVACAINE-EPINEPHRINE (PF) 0.25% -1:200000 IJ SOLN
INTRAMUSCULAR | Status: AC
  Filled 2019-05-02: qty 30

## 2019-05-02 MED ORDER — SODIUM CHLORIDE 0.9 % IR SOLN
Status: DC | PRN
  Administered 2019-05-02: 1

## 2019-05-02 MED ORDER — ROCURONIUM BROMIDE 10 MG/ML (PF) SYRINGE
PREFILLED_SYRINGE | INTRAVENOUS | Status: DC | PRN
  Administered 2019-05-02: 5 mg via INTRAVENOUS
  Administered 2019-05-02: 30 mg via INTRAVENOUS

## 2019-05-02 MED ORDER — BUPIVACAINE-EPINEPHRINE (PF) 0.25% -1:200000 IJ SOLN
INTRAMUSCULAR | Status: DC | PRN
  Administered 2019-05-02: 30 mL

## 2019-05-02 MED ORDER — FENTANYL CITRATE (PF) 100 MCG/2ML IJ SOLN
INTRAMUSCULAR | Status: AC
  Filled 2019-05-02: qty 2

## 2019-05-02 MED ORDER — HYDROCODONE-ACETAMINOPHEN 5-325 MG PO TABS
1.0000 | ORAL_TABLET | ORAL | Status: DC | PRN
  Administered 2019-05-02 – 2019-05-03 (×2): 1 via ORAL
  Filled 2019-05-02: qty 2
  Filled 2019-05-02 (×3): qty 1

## 2019-05-02 MED ORDER — METOCLOPRAMIDE HCL 5 MG PO TABS: 5.0000 mg | ORAL_TABLET | Freq: Three times a day (TID) | ORAL | Status: DC | PRN

## 2019-05-02 MED ORDER — DOCUSATE SODIUM 100 MG PO CAPS
100.0000 mg | ORAL_CAPSULE | Freq: Two times a day (BID) | ORAL | Status: DC
  Administered 2019-05-02 – 2019-05-04 (×4): 100 mg via ORAL
  Filled 2019-05-02 (×4): qty 1

## 2019-05-02 MED ORDER — MIDAZOLAM HCL 2 MG/2ML IJ SOLN
INTRAMUSCULAR | Status: DC | PRN
  Administered 2019-05-02 (×2): 1 mg via INTRAVENOUS

## 2019-05-02 MED ORDER — EPHEDRINE 5 MG/ML INJ
INTRAVENOUS | Status: AC
  Filled 2019-05-02: qty 10

## 2019-05-02 MED ORDER — TRAMADOL HCL 50 MG PO TABS
50.0000 mg | ORAL_TABLET | Freq: Four times a day (QID) | ORAL | Status: DC | PRN
  Administered 2019-05-03 – 2019-05-04 (×5): 50 mg via ORAL
  Filled 2019-05-02 (×5): qty 1

## 2019-05-02 MED ORDER — PROPOFOL 500 MG/50ML IV EMUL
INTRAVENOUS | Status: DC | PRN
  Administered 2019-05-02: 25 ug/kg/min via INTRAVENOUS

## 2019-05-02 MED ORDER — ONDANSETRON HCL 4 MG/2ML IJ SOLN
INTRAMUSCULAR | Status: DC | PRN
  Administered 2019-05-02: 4 mg via INTRAVENOUS

## 2019-05-02 MED ORDER — POLYETHYLENE GLYCOL 3350 17 G PO PACK: 17.0000 g | PACK | Freq: Every day | ORAL | Status: DC | PRN

## 2019-05-02 MED ORDER — PHENYLEPHRINE 40 MCG/ML (10ML) SYRINGE FOR IV PUSH (FOR BLOOD PRESSURE SUPPORT)
PREFILLED_SYRINGE | INTRAVENOUS | Status: AC
  Filled 2019-05-02: qty 10

## 2019-05-02 MED ORDER — PHENYLEPHRINE HCL (PRESSORS) 10 MG/ML IV SOLN
INTRAVENOUS | Status: AC
  Filled 2019-05-02: qty 2

## 2019-05-02 MED ORDER — DEXAMETHASONE SODIUM PHOSPHATE 10 MG/ML IJ SOLN
10.0000 mg | Freq: Once | INTRAMUSCULAR | Status: AC
  Administered 2019-05-03: 10 mg via INTRAVENOUS
  Filled 2019-05-02: qty 1

## 2019-05-02 MED ORDER — PHENOL 1.4 % MT LIQD: 1.0000 | OROMUCOSAL | Status: DC | PRN

## 2019-05-02 MED ORDER — ONDANSETRON HCL 4 MG/2ML IJ SOLN: 4.0000 mg | Freq: Four times a day (QID) | INTRAMUSCULAR | Status: DC | PRN

## 2019-05-02 MED ORDER — MORPHINE SULFATE (PF) 2 MG/ML IV SOLN: 0.5000 mg | INTRAVENOUS | Status: DC | PRN

## 2019-05-02 MED ORDER — SODIUM CHLORIDE 0.9 % IV SOLN: INTRAVENOUS | Status: DC

## 2019-05-02 MED ORDER — MENTHOL 3 MG MT LOZG: 1.0000 | LOZENGE | OROMUCOSAL | Status: DC | PRN

## 2019-05-02 MED ORDER — SUCCINYLCHOLINE CHLORIDE 200 MG/10ML IV SOSY
PREFILLED_SYRINGE | INTRAVENOUS | Status: AC
  Filled 2019-05-02: qty 20

## 2019-05-02 MED ORDER — POTASSIUM CHLORIDE CRYS ER 10 MEQ PO TBCR
10.0000 meq | EXTENDED_RELEASE_TABLET | Freq: Every day | ORAL | Status: DC
  Administered 2019-05-03 – 2019-05-04 (×2): 10 meq via ORAL
  Filled 2019-05-02 (×2): qty 1

## 2019-05-02 MED ORDER — FLEET ENEMA 7-19 GM/118ML RE ENEM: 1.0000 | ENEMA | Freq: Once | RECTAL | Status: DC | PRN

## 2019-05-02 SURGICAL SUPPLY — 42 items
BAG DECANTER FOR FLEXI CONT (MISCELLANEOUS) IMPLANT
BAG ZIPLOCK 12X15 (MISCELLANEOUS) IMPLANT
BLADE SAG 18X100X1.27 (BLADE) ×3 IMPLANT
CLOSURE WOUND 1/2 X4 (GAUZE/BANDAGES/DRESSINGS) ×1
COVER PERINEAL POST (MISCELLANEOUS) ×3 IMPLANT
COVER SURGICAL LIGHT HANDLE (MISCELLANEOUS) ×3 IMPLANT
COVER WAND RF STERILE (DRAPES) IMPLANT
CUP ACETBLR 48 OD SECTOR II (Hips) ×3 IMPLANT
DECANTER SPIKE VIAL GLASS SM (MISCELLANEOUS) ×3 IMPLANT
DRAPE STERI IOBAN 125X83 (DRAPES) ×3 IMPLANT
DRAPE U-SHAPE 47X51 STRL (DRAPES) ×6 IMPLANT
DRSG ADAPTIC 3X8 NADH LF (GAUZE/BANDAGES/DRESSINGS) ×3 IMPLANT
DRSG MEPILEX BORDER 4X4 (GAUZE/BANDAGES/DRESSINGS) ×3 IMPLANT
DRSG MEPILEX BORDER 4X8 (GAUZE/BANDAGES/DRESSINGS) ×3 IMPLANT
DURAPREP 26ML APPLICATOR (WOUND CARE) ×3 IMPLANT
ELECT REM PT RETURN 15FT ADLT (MISCELLANEOUS) ×3 IMPLANT
EVACUATOR 1/8 PVC DRAIN (DRAIN) ×3 IMPLANT
GLOVE BIO SURGEON STRL SZ8 (GLOVE) ×3 IMPLANT
GLOVE BIOGEL PI IND STRL 6.5 (GLOVE) ×1 IMPLANT
GLOVE BIOGEL PI IND STRL 8 (GLOVE) ×1 IMPLANT
GLOVE BIOGEL PI INDICATOR 6.5 (GLOVE) ×2
GLOVE BIOGEL PI INDICATOR 8 (GLOVE) ×2
GLOVE SURG SS PI 6.5 STRL IVOR (GLOVE) ×3 IMPLANT
GOWN STRL REUS W/TWL LRG LVL3 (GOWN DISPOSABLE) ×3 IMPLANT
GOWN STRL REUS W/TWL XL LVL3 (GOWN DISPOSABLE) ×3 IMPLANT
HEAD CERAMIC DELTA 28 P1.5 HIP (Head) ×3 IMPLANT
HOLDER FOLEY CATH W/STRAP (MISCELLANEOUS) ×3 IMPLANT
KIT TURNOVER KIT A (KITS) IMPLANT
LINER MARATHON 28 48 (Hips) ×3 IMPLANT
MANIFOLD NEPTUNE II (INSTRUMENTS) ×3 IMPLANT
PACK ANTERIOR HIP CUSTOM (KITS) ×3 IMPLANT
STEM FEM ACTIS STD SZ4 (Stem) ×3 IMPLANT
STRIP CLOSURE SKIN 1/2X4 (GAUZE/BANDAGES/DRESSINGS) ×2 IMPLANT
SUT ETHIBOND NAB CT1 #1 30IN (SUTURE) ×3 IMPLANT
SUT MNCRL AB 4-0 PS2 18 (SUTURE) ×3 IMPLANT
SUT STRATAFIX 0 PDS 27 VIOLET (SUTURE) ×3
SUT VIC AB 2-0 CT1 27 (SUTURE) ×4
SUT VIC AB 2-0 CT1 TAPERPNT 27 (SUTURE) ×2 IMPLANT
SUTURE STRATFX 0 PDS 27 VIOLET (SUTURE) ×1 IMPLANT
SYR 50ML LL SCALE MARK (SYRINGE) ×3 IMPLANT
TRAY FOLEY MTR SLVR 16FR STAT (SET/KITS/TRAYS/PACK) ×3 IMPLANT
YANKAUER SUCT BULB TIP 10FT TU (MISCELLANEOUS) ×3 IMPLANT

## 2019-05-02 NOTE — Anesthesia Procedure Notes (Signed)
Procedure Name: Intubation Date/Time: 05/02/2019 2:07 PM Performed by: Minerva Ends, CRNA Pre-anesthesia Checklist: Patient identified, Emergency Drugs available, Suction available and Patient being monitored Patient Re-evaluated:Patient Re-evaluated prior to induction Oxygen Delivery Method: Circle System Utilized Preoxygenation: Pre-oxygenation with 100% oxygen Induction Type: IV induction Ventilation: Mask ventilation without difficulty Laryngoscope Size: Miller and 2 Grade View: Grade I Tube type: Oral Number of attempts: 1 Airway Equipment and Method: Stylet and Oral airway Placement Confirmation: ETT inserted through vocal cords under direct vision,  positive ETCO2 and breath sounds checked- equal and bilateral Secured at: 20 cm Tube secured with: Tape Dental Injury: Teeth and Oropharynx as per pre-operative assessment  Comments: Smooth IV induction [place]--- pt with poor dentition chipped broken teeth in front-- CRNA intubation - atraumatic-- teeth and mouth as preop-- bilat BS

## 2019-05-02 NOTE — Anesthesia Postprocedure Evaluation (Signed)
Anesthesia Post Note  Patient: Rennette Toure  Procedure(s) Performed: TOTAL HIP ARTHROPLASTY ANTERIOR APPROACH (Right Hip)     Patient location during evaluation: PACU Anesthesia Type: General Level of consciousness: awake and alert Pain management: pain level controlled Vital Signs Assessment: post-procedure vital signs reviewed and stable Respiratory status: spontaneous breathing, nonlabored ventilation, respiratory function stable and patient connected to nasal cannula oxygen Cardiovascular status: blood pressure returned to baseline and stable Postop Assessment: no apparent nausea or vomiting Anesthetic complications: no    Last Vitals:  Vitals:   05/02/19 1730 05/02/19 1745  BP: 118/65   Pulse: 75 76  Resp: 15 13  Temp:    SpO2: 100% 100%    Last Pain:  Vitals:   05/02/19 1745  TempSrc:   PainSc: 5                  Lucretia Kern

## 2019-05-02 NOTE — Op Note (Signed)
OPERATIVE REPORT- TOTAL HIP ARTHROPLASTY   PREOPERATIVE DIAGNOSIS: Osteoarthritis of the Right hip.   POSTOPERATIVE DIAGNOSIS: Osteoarthritis of the Right  hip.   PROCEDURE: Right total hip arthroplasty, anterior approach.   SURGEON: Ollen Gross, MD   ASSISTANT: Dimitri Ped, PA-C  ANESTHESIA:  General  ESTIMATED BLOOD LOSS:-450 mL    DRAINS: Hemovac x1.   COMPLICATIONS: None   CONDITION: PACU - hemodynamically stable.   BRIEF CLINICAL NOTE: Michelle Lloyd is a 52 y.o. 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 She had a recent successful left Total Hip arthroplasty. The patient has failed nonoperative management and presents for  total hip arthroplasty.   PROCEDURE IN DETAIL: After successful administration of spinal  anesthetic, the traction boots for the [facility] bed were placed on both  feet and the patient was placed onto the [facility] bed, boots placed into the leg  holders. The Right hip was then isolated from the perineum with plastic  drapes and prepped and draped in the usual sterile fashion. ASIS and  greater trochanter were marked and a oblique incision was made, starting  at about 1 cm lateral and 2 cm distal to the ASIS and coursing towards  the anterior cortex of the femur. The skin was cut with a 10 blade  through subcutaneous tissue to the level of the fascia overlying the  tensor fascia lata muscle. The fascia was then incised in line with the  incision at the junction of the anterior third and posterior 2/3rd. The  muscle was teased off the fascia and then the interval between the TFL  and the rectus was developed. The Hohmann retractor was then placed at  the top of the femoral neck over the capsule. The vessels overlying the  capsule were cauterized and the fat on top of the capsule was removed.  A Hohmann retractor was then placed  anterior underneath the rectus  femoris to give exposure to the entire anterior capsule. A T-shaped  capsulotomy was performed. The edges were tagged and the femoral head  was identified.       Osteophytes are removed off the superior acetabulum.  The femoral neck was then cut in situ with an oscillating saw. Traction  was then applied to the left lower extremity utilizing the [facility]  traction. The femoral head was then removed. Retractors were placed  around the acetabulum and then circumferential removal of the labrum was  performed. Osteophytes were also removed. Reaming starts at 45 mm to  medialize and  Increased in 2 mm increments to 47 mm. We reamed in  approximately 40 degrees of abduction, 20 degrees anteversion. A 48 mm  pinnacle acetabular shell was then impacted in anatomic position under  fluoroscopic guidance with excellent purchase. We did not need to place  any additional dome screws. A 28 mm neutral + 4 marathon liner was then  placed into the acetabular shell.       The femoral lift was then placed along the lateral aspect of the femur  just distal to the vastus ridge. The leg was  externally rotated and capsule  was stripped off the inferior aspect of the femoral neck down to the  level of the lesser trochanter, this was done with electrocautery. The femur was lifted after this was performed. The  leg was then placed in an extended and adducted position essentially delivering  the femur. We also removed the capsule superiorly and the piriformis from the piriformis fossa to gain excellent exposure of the  proximal femur. Rongeur was used to remove some cancellous bone to get  into the lateral portion of the proximal femur for placement of the  initial starter reamer. The starter broaches was placed  the starter broach  and was shown to go down the center of the canal. Broaching  with the Actis system was then performed starting at size 0  coursing  Up to size 4. A size 4 had  excellent torsional and rotational  and axial stability. The trial standard offset neck was then placed  with a 28 + 1.5 trial head. The hip was then reduced. We confirmed that  the stem was in the canal both on AP and lateral x-rays. It also has excellent sizing. The hip was reduced with outstanding stability through full extension and full external rotation.. AP pelvis was taken and the leg lengths were measured and found to be equal. Hip was then dislocated again and the femoral head and neck removed. The  femoral broach was removed. Size 4 Actis stem with a standard offset  neck was then impacted into the femur following native anteversion. Has  excellent purchase in the canal. Excellent torsional and rotational and  axial stability. It is confirmed to be in the canal on AP and lateral  fluoroscopic views. The 28 + 1.5 ceramic head was placed and the hip  reduced with outstanding stability. Again AP pelvis was taken and it  confirmed that the leg lengths were equal. The wound was then copiously  irrigated with saline solution and the capsule reattached and repaired  with Ethibond suture. 30 ml of .25% Bupivicaine was  injected into the capsule and into the edge of the tensor fascia lata as well as subcutaneous tissue. The fascia overlying the tensor fascia lata was then closed with a running #1 V-Loc. Subcu was closed with interrupted 2-0 Vicryl and subcuticular running 4-0 Monocryl. Incision was cleaned  and dried. Steri-Strips and a bulky sterile dressing applied. Hemovac  drain was hooked to suction and then the patient was awakened and transported to  recovery in stable condition.        Please note that a surgical assistant was a medical necessity for this procedure to perform it in a safe and expeditious manner. Assistant was necessary to provide appropriate retraction of vital neurovascular structures and to prevent femoral fracture and allow for anatomic placement of the  prosthesis.  Ollen Gross, M.D.

## 2019-05-02 NOTE — Transfer of Care (Signed)
Immediate Anesthesia Transfer of Care Note  Patient: Michelle Lloyd  Procedure(s) Performed: TOTAL HIP ARTHROPLASTY ANTERIOR APPROACH (Right Hip)  Patient Location: PACU  Anesthesia Type:General  Level of Consciousness: awake and alert   Airway & Oxygen Therapy: Patient Spontanous Breathing and Patient connected to face mask oxygen  Post-op Assessment: Report given to RN and Post -op Vital signs reviewed and stable  Post vital signs: Reviewed and stable  Last Vitals:  Vitals Value Taken Time  BP 95/61 05/02/2019  4:16 PM  Temp    Pulse 70 05/02/2019  4:22 PM  Resp 19 05/02/2019  4:22 PM  SpO2 100 % 05/02/2019  4:22 PM  Vitals shown include unvalidated device data.  Last Pain:  Vitals:   05/02/19 1205  TempSrc: Oral         Complications: no apparent anes comp-- A line present required Neo for case--

## 2019-05-02 NOTE — Discharge Instructions (Addendum)
°Dr. Frank Aluisio °Total Joint Specialist °[facility] °[address] °[place], [place] [address] °[phone] ° °ANTERIOR APPROACH TOTAL HIP REPLACEMENT POSTOPERATIVE DIRECTIONS ° ° °Hip Rehabilitation, Guidelines Following Surgery  °The results of a hip operation are greatly improved after range of motion and muscle strengthening exercises. Follow all safety measures which are given to protect your hip. If any of these exercises cause increased pain or swelling in your joint, decrease the amount until you are comfortable again. Then slowly increase the exercises. Call your caregiver if you have problems or questions.  ° °HOME CARE INSTRUCTIONS  °• Remove items at home which could result in a fall. This includes throw rugs or furniture in walking pathways.  °· ICE to the affected hip every three hours for 30 minutes at a time and then as needed for pain and swelling.  Continue to use ice on the hip for pain and swelling from surgery. You may notice swelling that will progress down to the foot and ankle.  This is normal after surgery.  Elevate the leg when you are not up walking on it.   °· Continue to use the breathing machine which will help keep your temperature down.  It is common for your temperature to cycle up and down following surgery, especially at night when you are not up moving around and exerting yourself.  The breathing machine keeps your lungs expanded and your temperature down. ° °DIET °You may resume your previous home diet once your are discharged from the hospital. ° °DRESSING / WOUND CARE / SHOWERING °You may change your dressing 3-5 days after surgery.  Then change the dressing every day with sterile gauze.  Please use good hand washing techniques before changing the dressing.  Do not use any lotions or creams on the incision until instructed by your surgeon. °You may start showering once you are discharged home but do not submerge the incision under water. Just pat the  incision dry and apply a dry gauze dressing on daily. °Change the surgical dressing daily and reapply a dry dressing each time. ° °ACTIVITY °Walk with your walker as instructed. °Use walker as long as suggested by your caregivers. °Avoid periods of inactivity such as sitting longer than an hour when not asleep. This helps prevent blood clots.  °You may resume a sexual relationship in one month or when given the OK by your doctor.  °You may return to work once you are cleared by your doctor.  °Do not drive a car for 6 weeks or until released by you surgeon.  °Do not drive while taking narcotics. ° °WEIGHT BEARING °Weight bearing as tolerated with assist device (walker, cane, etc) as directed, use it as long as suggested by your surgeon or therapist, typically at least 4-6 weeks. ° °POSTOPERATIVE CONSTIPATION PROTOCOL °Constipation - defined medically as fewer than three stools per week and severe constipation as less than one stool per week. ° °One of the most common issues patients have following surgery is constipation.  Even if you have a regular bowel pattern at home, your normal regimen is likely to be disrupted due to multiple reasons following surgery.  Combination of anesthesia, postoperative narcotics, change in appetite and fluid intake all can affect your bowels.  In order to avoid complications following surgery, here are some recommendations in order to help you during your recovery period. ° °Colace (docusate) - Pick up an over-the-counter form of Colace or another stool softener and take twice a day   as long as you are requiring postoperative pain medications.  Take with a full glass of water daily.  If you experience loose stools or diarrhea, hold the colace until you stool forms back up.  If your symptoms do not get better within 1 week or if they get worse, check with your doctor. ° °Dulcolax (bisacodyl) - Pick up over-the-counter and take as directed by the product packaging as needed to assist with  the movement of your bowels.  Take with a full glass of water.  Use this product as needed if not relieved by Colace only.  ° °MiraLax (polyethylene glycol) - Pick up over-the-counter to have on hand.  MiraLax is a solution that will increase the amount of water in your bowels to assist with bowel movements.  Take as directed and can mix with a glass of water, juice, soda, coffee, or tea.  Take if you go more than two days without a movement. °Do not use MiraLax more than once per day. Call your doctor if you are still constipated or irregular after using this medication for 7 days in a row. ° °If you continue to have problems with postoperative constipation, please contact the office for further assistance and recommendations.  If you experience "the worst abdominal pain ever" or develop nausea or vomiting, please contact the office immediatly for further recommendations for treatment. ° °ITCHING ° If you experience itching with your medications, try taking only a single pain pill, or even half a pain pill at a time.  You can also use Benadryl over the counter for itching or also to help with sleep.  ° °TED HOSE STOCKINGS °Wear the elastic stockings on both legs for three weeks following surgery during the day but you may remove then at night for sleeping. ° °MEDICATIONS °See your medication summary on the “After Visit Summary” that the nursing staff will review with you prior to discharge.  You may have some home medications which will be placed on hold until you complete the course of blood thinner medication.  It is important for you to complete the blood thinner medication as prescribed by your surgeon.  Continue your approved medications as instructed at time of discharge. ° °PRECAUTIONS °If you experience chest pain or shortness of breath - call 911 immediately for transfer to the hospital emergency department.  °If you develop a fever greater that 101 F, purulent drainage from wound, increased redness or  drainage from wound, foul odor from the wound/dressing, or calf pain - CONTACT YOUR SURGEON.   °                                                °FOLLOW-UP APPOINTMENTS °Make sure you keep all of your appointments after your operation with your surgeon and caregivers. You should call the office at the above phone number and make an appointment for approximately two weeks after the date of your surgery or on the date instructed by your surgeon outlined in the "After Visit Summary". ° °RANGE OF MOTION AND STRENGTHENING EXERCISES  °These exercises are designed to help you keep full movement of your hip joint. Follow your caregiver's or physical therapist's instructions. Perform all exercises about fifteen times, three times per day or as directed. Exercise both hips, even if you have had only one joint replacement. These exercises can be done on   a training (exercise) mat, on the floor, on a table or on a bed. Use whatever works the best and is most comfortable for you. Use music or television while you are exercising so that the exercises are a pleasant break in your day. This will make your life better with the exercises acting as a break in routine you can look forward to.  °• Lying on your back, slowly slide your foot toward your buttocks, raising your knee up off the floor. Then slowly slide your foot back down until your leg is straight again.  °• Lying on your back spread your legs as far apart as you can without causing discomfort.  °• Lying on your side, raise your upper leg and foot straight up from the floor as far as is comfortable. Slowly lower the leg and repeat.  °• Lying on your back, tighten up the muscle in the front of your thigh (quadriceps muscles). You can do this by keeping your leg straight and trying to raise your heel off the floor. This helps strengthen the largest muscle supporting your knee.  °• Lying on your back, tighten up the muscles of your buttocks both with the legs straight and with  the knee bent at a comfortable angle while keeping your heel on the floor.  ° °IF YOU ARE TRANSFERRED TO A SKILLED REHAB FACILITY °If the patient is transferred to a skilled rehab facility following release from the hospital, a list of the current medications will be sent to the facility for the patient to continue.  When discharged from the skilled rehab facility, please have the facility set up the patient's Home Health Physical Therapy prior to being released. Also, the skilled facility will be responsible for providing the patient with their medications at time of release from the facility to include their pain medication, the muscle relaxants, and their blood thinner medication. If the patient is still at the rehab facility at time of the two week follow up appointment, the skilled rehab facility will also need to assist the patient in arranging follow up appointment in our office and any transportation needs. ° °MAKE SURE YOU:  °• Understand these instructions.  °• Get help right away if you are not doing well or get worse.  ° ° °Pick up stool softner and laxative for home use following surgery while on pain medications. °Do not submerge incision under water. °Please use good hand washing techniques while changing dressing each day. °May shower starting three days after surgery. °Please use a clean towel to pat the incision dry following showers. °Continue to use ice for pain and swelling after surgery. °Do not use any lotions or creams on the incision until instructed by your surgeon. ° °Information on my medicine - XARELTO® (Rivaroxaban) ° °This medication education was reviewed with me or my healthcare representative as part of my discharge preparation.  The pharmacist that spoke with me during my hospital stay was:  Latrel Szymczak A, RPH ° °Why was Xarelto® prescribed for you? °Xarelto® was prescribed for you to reduce the risk of blood clots forming after orthopedic surgery. The medical term for these  abnormal blood clots is venous thromboembolism (VTE). ° °What do you need to know about xarelto® ? °Take your Xarelto® ONCE DAILY at the same time every day. °You may take it either with or without food. ° °If you have difficulty swallowing the tablet whole, you may crush it and mix in applesauce just prior to taking your dose. ° °  Take Xarelto® exactly as prescribed by your doctor and DO NOT stop taking Xarelto® without talking to the doctor who prescribed the medication.  Stopping without other VTE prevention medication to take the place of Xarelto® may increase your risk of developing a clot. ° °After discharge, you should have regular check-up appointments with your healthcare provider that is prescribing your Xarelto®.   ° °What do you do if you miss a dose? °If you miss a dose, take it as soon as you remember on the same day then continue your regularly scheduled once daily regimen the next day. Do not take two doses of Xarelto® on the same day.  ° °Important Safety Information °A possible side effect of Xarelto® is bleeding. You should call your healthcare provider right away if you experience any of the following: °? Bleeding from an injury or your nose that does not stop. °? Unusual colored urine (red or dark brown) or unusual colored stools (red or black). °? Unusual bruising for unknown reasons. °? A serious fall or if you hit your head (even if there is no bleeding). ° °Some medicines may interact with Xarelto® and might increase your risk of bleeding while on Xarelto®. To help avoid this, consult your healthcare provider or pharmacist prior to using any new prescription or non-prescription medications, including herbals, vitamins, non-steroidal anti-inflammatory drugs (NSAIDs) and supplements. ° °This website has more information on Xarelto®: www.xarelto.com. ° ° ° ° °

## 2019-05-02 NOTE — Interval H&P Note (Signed)
History and Physical Interval Note:  05/02/2019 1:49 PM  Michelle Lloyd  has presented today for surgery, with the diagnosis of right hip osteoarthritis.  The various methods of treatment have been discussed with the patient and family. After consideration of risks, benefits and other options for treatment, the patient has consented to  Procedure(s) with comments: TOTAL HIP ARTHROPLASTY ANTERIOR APPROACH (Right) - as a surgical intervention.  The patient's history has been reviewed, patient examined, no change in status, stable for surgery.  I have reviewed the patient's chart and labs.  Questions were answered to the patient's satisfaction.     Homero Fellers Jewelle Whitner

## 2019-05-02 NOTE — Anesthesia Procedure Notes (Signed)
Date/Time: 05/02/2019 4:10 PM Performed by: Minerva Ends, CRNA Oxygen Delivery Method: Simple face mask Placement Confirmation: positive ETCO2 and breath sounds checked- equal and bilateral Dental Injury: Teeth and Oropharynx as per pre-operative assessment

## 2019-05-02 NOTE — Anesthesia Procedure Notes (Signed)
Arterial Line Insertion Start/End5/27/2020 2:07 PM Performed by: Lucretia Kern, MD, Minerva Ends, CRNA, CRNA  Preanesthetic checklist: patient identified, IV checked, site marked, risks and benefits discussed, surgical consent, monitors and equipment checked, pre-op evaluation, timeout performed and anesthesia consent radial was placed Catheter size: 20 G Hand hygiene performed  and maximum sterile barriers used  Allen's test indicative of satisfactory collateral circulation Attempts: 2 Procedure performed using ultrasound guided technique. Ultrasound Notes:anatomy identified, needle tip was noted to be adjacent to the nerve/plexus identified and no ultrasound evidence of intravascular and/or intraneural injection Following insertion, dressing applied and Biopatch. Post procedure assessment: normal  Post procedure complications: local hematoma. Patient tolerated the procedure well with no immediate complications. Additional procedure comments: CRNA placed 20 to left radial-- hematoma present with poor waveform-- removed --Witman MD placed 20g to left radial using ultrasound-- good return and waveform.

## 2019-05-03 ENCOUNTER — Encounter (HOSPITAL_COMMUNITY): Payer: Self-pay | Admitting: Orthopedic Surgery

## 2019-05-03 LAB — CBC
HCT: 28.4 % — ABNORMAL LOW (ref 36.0–46.0)
Hemoglobin: 8.8 g/dL — ABNORMAL LOW (ref 12.0–15.0)
MCH: 29.4 pg (ref 26.0–34.0)
MCHC: 31 g/dL (ref 30.0–36.0)
MCV: 95 fL (ref 80.0–100.0)
Platelets: 214 10*3/uL (ref 150–400)
RBC: 2.99 MIL/uL — ABNORMAL LOW (ref 3.87–5.11)
RDW: 13.6 % (ref 11.5–15.5)
WBC: 12.4 10*3/uL — ABNORMAL HIGH (ref 4.0–10.5)
nRBC: 0 % (ref 0.0–0.2)

## 2019-05-03 LAB — BASIC METABOLIC PANEL
Anion gap: 10 (ref 5–15)
BUN: 14 mg/dL (ref 6–20)
CO2: 22 mmol/L (ref 22–32)
Calcium: 8.1 mg/dL — ABNORMAL LOW (ref 8.9–10.3)
Chloride: 106 mmol/L (ref 98–111)
Creatinine, Ser: 0.53 mg/dL (ref 0.44–1.00)
GFR calc Af Amer: >60 mL/min (ref >60)
GFR calc non Af Amer: >60 mL/min (ref >60)
Glucose, Bld: 120 mg/dL — ABNORMAL HIGH (ref 70–99)
Potassium: 3.9 mmol/L (ref 3.5–5.1)
Sodium: 138 mmol/L (ref 135–145)

## 2019-05-03 MED ORDER — METHOCARBAMOL 500 MG PO TABS: 500.0000 mg | ORAL_TABLET | Freq: Four times a day (QID) | ORAL | 0 refills | Status: DC | PRN

## 2019-05-03 MED ORDER — RIVAROXABAN 10 MG PO TABS: 10.0000 mg | ORAL_TABLET | Freq: Every day | ORAL | 0 refills | Status: DC

## 2019-05-03 MED ORDER — HYDROCODONE-ACETAMINOPHEN 5-325 MG PO TABS: 1.0000 | ORAL_TABLET | Freq: Four times a day (QID) | ORAL | 0 refills | Status: DC | PRN

## 2019-05-03 MED ORDER — TRAMADOL HCL 50 MG PO TABS: 50.0000 mg | ORAL_TABLET | Freq: Four times a day (QID) | ORAL | 0 refills | Status: DC | PRN

## 2019-05-03 MED ORDER — SODIUM CHLORIDE 0.9 % IV BOLUS
250.0000 mL | Freq: Once | INTRAVENOUS | Status: AC
  Administered 2019-05-03: 250 mL via INTRAVENOUS

## 2019-05-03 NOTE — Progress Notes (Addendum)
Subjective: 1 Day Post-Op Procedure(s) (LRB): TOTAL HIP ARTHROPLASTY ANTERIOR APPROACH (Right) Patient reports pain as mild.   Patient seen in rounds with Dr. Lequita Halt. Patient is well, and has had no acute complaints or problems other than some mild abdominal pain that she associates with the hydrocodone. Foley catheter removed this AM. No issues overnight. Denies chest pain or SOB. BP soft this AM, however per patent this is her normal. Will hold off on any additional fluids at this time due to cardiac hx.  We will begin therapy today.   Objective: Vital signs in last 24 hours: Temp:  [98.2 F (36.8 C)-99.2 F (37.3 C)] 99 F (37.2 C) (05/28 0521) Pulse Rate:  [70-93] 93 (05/28 0521) Resp:  [11-23] 16 (05/28 0521) BP: (95-131)/(48-76) 107/68 (05/28 0521) SpO2:  [100 %] 100 % (05/28 0521) Arterial Line BP: (99-126)/(47-61) 99/54 (05/27 1730) Weight:  [46.3 kg] 46.3 kg (05/27 1816)  Intake/Output from previous day:  Intake/Output Summary (Last 24 hours) at 05/03/2019 0745 Last data filed at 05/03/2019 0600 Gross per 24 hour  Intake 2150 ml  Output 1715 ml  Net 435 ml    Labs: Recent Labs    05/02/19 1556 05/03/19 0355  HGB 8.8* 8.8*   Recent Labs    05/02/19 1556 05/03/19 0355  WBC  --  12.4*  RBC  --  2.99*  HCT 26.0* 28.4*  PLT  --  214   Recent Labs    05/02/19 1556 05/03/19 0355  NA 136 138  K 3.8 3.9  CL  --  106  CO2  --  22  BUN  --  14  CREATININE  --  0.53  GLUCOSE  --  120*  CALCIUM  --  8.1*   Exam: General - Patient is Alert and Oriented Extremity - Neurologically intact Neurovascular intact Sensation intact distally Dorsiflexion/Plantar flexion intact Dressing - dressing C/D/I Motor Function - intact, moving foot and toes well on exam.   Past Medical History:  Diagnosis Date  . Allergy   . Anemia   . Bilateral primary osteoarthritis of hip   . Bilateral wrist pain   . Blood transfusion without reported diagnosis   . Cardiac  device in situ 07/06/2018   Biotronik BIVICD , right chest  . CHB (complete heart block) (HCC)    History of   . CHF (congestive heart failure) (HCC)   . Complication of anesthesia    hard to wake up at times in past  . H/O prosthetic aortic valve replacement   . Heart murmur   . History of bronchitis   . History of cardiac arrest 01/2017  . History of repair of coarctation of aorta   . History of sustained ventricular tachycardia   . History of ventricular fibrillation 01/2017  . Hyperlipidemia   . Hypertension   . Non-ischemic cardiomyopathy (HCC)   . Pneumonia   . PONV (postoperative nausea and vomiting)   . Presence of permanent cardiac pacemaker    right chest  . Systolic heart failure (HCC)   . Turner syndrome   . Wears hearing aid in both ears     Assessment/Plan: 1 Day Post-Op Procedure(s) (LRB): TOTAL HIP ARTHROPLASTY ANTERIOR APPROACH (Right) Active Problems:   OA (osteoarthritis) of hip  Estimated body mass index is 19.92 kg/m as calculated from the following:   Height as of this encounter: 5' (1.524 m).   Weight as of this encounter: 46.3 kg. Advance diet Up with therapy D/C IV fluids  DVT Prophylaxis - Xarelto Weight bearing as tolerated. D/C O2 and pulse ox and try on room air. Hemovac pulled without difficulty, will begin therapy.  Hemoglobin stable at 8.8. Will hold off on hydrocodone and try to manage pain with just tramadol and tylenol due to mild abdominal pain.  Plan is to go Home after hospital stay. Possible discharge this afternoon with HEP if she progresses with physical therapy and is meeting her goals. Otherwise will stay overnight and d/c tomorrow. Follow-up in the office in 2 weeks.   Arther Abbott, PA-C Orthopedic Surgery 05/03/2019, 7:45 AM

## 2019-05-03 NOTE — Evaluation (Signed)
Physical Therapy Evaluation Patient Details Name: Michelle Lloyd MRN: [id number] DOB: [birth year] Today's Date: 05/03/2019   History of Present Illness  Pt s/p R THR and with hx of L THR 12/19, CHF, CHB, and cardiomyopathy  Clinical Impression  Pt s/p R THR and presents with functional mobility limitations 2* decreased R LE strength/ROM and post op pain limiting functional mobility.  This session, pt limited by dizziness and BP drop to 82/45 with minimal OOB activity.  Pt should progress to dc home with family assist.    Follow Up Recommendations Follow surgeon's recommendation for DC plan and follow-up therapies    Equipment Recommendations  Rolling walker with 5" wheels;3in1 (PT)(youth level RW)    Recommendations for Other Services       Precautions / Restrictions Precautions Precautions: Fall Precaution Comments: Pt orthostatic this am - RN present Restrictions Weight Bearing Restrictions: No Other Position/Activity Restrictions: WBAT      Mobility  Bed Mobility               General bed mobility comments: Pt sitting at EOB on arrival to room - pt states no assist to get there  Transfers Overall transfer level: Needs assistance Equipment used: Rolling walker (2 wheeled) Transfers: Sit to/from [employer] Sit to Stand: Min assist Stand pivot transfers: Min assist       General transfer comment: sit<>stand x 3 and stand pvt with RW bed to chair  Ambulation/Gait             General Gait Details: bed to chair only 2* orthostatic hypotension - pt dizzy with BP 82/45 in standing  Stairs            Wheelchair Mobility    Modified Rankin (Stroke Patients Only)       Balance                                             Pertinent Vitals/Pain Pain Assessment: 0-10 Pain Score: 3  Pain Location: R hip with standing erect Pain Descriptors / Indicators: Aching;Sore Pain Intervention(s): Limited activity  within patient's tolerance;Monitored during session;Premedicated before session;Ice applied    Home Living Family/patient expects to be discharged to:: Private residence Living Arrangements: Spouse/significant other;Children Available Help at Discharge: Family Type of Home: House Home Access: Level entry     Home Layout: Able to live on main level with bedroom/bathroom Home Equipment: Cane - single point;Walker - 2 wheels      Prior Function Level of Independence: Independent               Hand Dominance        Extremity/Trunk Assessment   Upper Extremity Assessment Upper Extremity Assessment: Overall WFL for tasks assessed    Lower Extremity Assessment Lower Extremity Assessment: RLE deficits/detail       Communication   Communication: No difficulties  Cognition Arousal/Alertness: Awake/alert Behavior During Therapy: WFL for tasks assessed/performed Overall Cognitive Status: Within Functional Limits for tasks assessed                                        General Comments      Exercises     Assessment/Plan    PT Assessment Patient needs continued PT services  PT Problem List Decreased  strength;Decreased range of motion;Decreased activity tolerance;Decreased balance;Decreased mobility;Decreased knowledge of use of DME;Pain       PT Treatment Interventions DME instruction;Gait training;Stair training;Functional mobility training;Therapeutic activities;Therapeutic exercise;Patient/family education    PT Goals (Current goals can be found in the Care Plan section)  Acute Rehab PT Goals Patient Stated Goal: Resume previous lifestyle PT Goal Formulation: With patient Time For Goal Achievement: 05/10/19 Potential to Achieve Goals: Good    Frequency 7X/week   Barriers to discharge        Co-evaluation               AM-PAC PT "6 Clicks" Mobility  Outcome Measure Help needed turning from your back to your side while in a flat  bed without using bedrails?: A Little Help needed moving from lying on your back to sitting on the side of a flat bed without using bedrails?: A Little Help needed moving to and from a bed to a chair (including a wheelchair)?: A Little Help needed standing up from a chair using your arms (e.g., wheelchair or bedside chair)?: A Little Help needed to walk in hospital room?: A Lot Help needed climbing 3-5 steps with a railing? : A Lot 6 Click Score: 16    End of Session Equipment Utilized During Treatment: Gait belt Activity Tolerance: Patient limited by fatigue Patient left: in chair;with call bell/phone within reach;with chair alarm set;with nursing/sitter in room Nurse Communication: Mobility status PT Visit Diagnosis: Difficulty in walking, not elsewhere classified (R26.2)    Time: 4163-8453 PT Time Calculation (min) (ACUTE ONLY): 30 min   Charges:   PT Evaluation $PT Eval Low Complexity: 1 Low PT Treatments $Therapeutic Activity: 8-22 mins        Mauro Kaufmann PT Acute Rehabilitation Services Pager [phone] Office [phone]   Beatryce Colombo 05/03/2019, 12:56 PM

## 2019-05-03 NOTE — Plan of Care (Signed)
  Problem: Education: Goal: Knowledge of the prescribed therapeutic regimen will improve Outcome: Progressing   Problem: Activity: Goal: Ability to avoid complications of mobility impairment will improve Outcome: Progressing Goal: Ability to tolerate increased activity will improve Outcome: Progressing   

## 2019-05-03 NOTE — Progress Notes (Signed)
Physical Therapy Treatment Patient Details Name: Michelle Lloyd MRN: [id number] DOB: 1966/01/26 Today's Date: 05/03/2019    History of Present Illness Pt s/p R THR and with hx of L THR 12/19, CHF, CHB, and cardiomyopathy    PT Comments    Pt with increased activity tolerance this pm and with no c/o dizziness with standing.  Pt with marked stooped posture and unable to attain full extension either hip or knee in standing or in supine.  Pt also noted to have very limited abduction bilaterally.  Pt states she did not do well attempting to follow HEP with previous hip surgery and this is evidenced by significant limitations in ROM noted on bilateral hips.   Pt would greatly benefit from follow up HHPT to address these deficits and maximize outcome on this most recent surgery.   Follow Up Recommendations  Follow surgeon's recommendation for DC plan and follow-up therapies;Home health PT     Equipment Recommendations  Rolling walker with 5" wheels;3in1 (PT)    Recommendations for Other Services       Precautions / Restrictions Precautions Precautions: Fall Precaution Comments: Pt orthostatic this am - RN present Restrictions Weight Bearing Restrictions: No Other Position/Activity Restrictions: WBAT    Mobility  Bed Mobility Overal bed mobility: Needs Assistance Bed Mobility: Sit to Supine       Sit to supine: Min assist;Mod assist   General bed mobility comments: cues for sequence and assist to manage bilt LEs onto bed  Transfers Overall transfer level: Needs assistance Equipment used: Rolling walker (2 wheeled) Transfers: Sit to/from Stand Sit to Stand: Min assist Stand pivot transfers: Min assist       General transfer comment: cues for LE management and use of UEs to self assist  Ambulation/Gait Ambulation/Gait assistance: Min assist Gait Distance (Feet): 34 Feet Assistive device: Rolling walker (2 wheeled) Gait Pattern/deviations: Step-to  pattern;Decreased step length - right;Decreased step length - left;Shuffle;Trunk flexed Gait velocity: decr   General Gait Details: cues for posture, posture, posture, position from RW and sequence   Stairs             Wheelchair Mobility    Modified Rankin (Stroke Patients Only)       Balance Overall balance assessment: Needs assistance Sitting-balance support: No upper extremity supported;Feet supported Sitting balance-Leahy Scale: Good     Standing balance support: Bilateral upper extremity supported Standing balance-Leahy Scale: Poor                              Cognition Arousal/Alertness: Awake/alert Behavior During Therapy: WFL for tasks assessed/performed Overall Cognitive Status: Within Functional Limits for tasks assessed                                        Exercises Total Joint Exercises Ankle Circles/Pumps: AROM;Both;20 reps;Supine Heel Slides: AAROM;Right;15 reps;Supine Hip ABduction/ADduction: AAROM;Right;10 reps;Supine    General Comments        Pertinent Vitals/Pain Pain Assessment: 0-10 Pain Score: 5  Pain Location: R hip with standing erect Pain Descriptors / Indicators: Aching;Sore Pain Intervention(s): Limited activity within patient's tolerance;Monitored during session;Premedicated before session;Ice applied    Home Living Family/patient expects to be discharged to:: Private residence Living Arrangements: Spouse/significant other;Children Available Help at Discharge: Family Type of Home: House Home Access: Level entry   Home Layout: Able to live on  main level with bedroom/bathroom Home Equipment: Cane - single point;Walker - 2 wheels      Prior Function Level of Independence: Independent          PT Goals (current goals can now be found in the care plan section) Acute Rehab PT Goals Patient Stated Goal: Resume previous lifestyle PT Goal Formulation: With patient Time For Goal Achievement:  05/10/19 Potential to Achieve Goals: Good Progress towards PT goals: Progressing toward goals    Frequency    7X/week      PT Plan Discharge plan needs to be updated    Co-evaluation              AM-PAC PT "6 Clicks" Mobility   Outcome Measure  Help needed turning from your back to your side while in a flat bed without using bedrails?: A Little Help needed moving from lying on your back to sitting on the side of a flat bed without using bedrails?: A Little Help needed moving to and from a bed to a chair (including a wheelchair)?: A Little Help needed standing up from a chair using your arms (e.g., wheelchair or bedside chair)?: A Little Help needed to walk in hospital room?: A Little Help needed climbing 3-5 steps with a railing? : A Lot 6 Click Score: 17    End of Session Equipment Utilized During Treatment: Gait belt Activity Tolerance: Patient limited by fatigue Patient left: in bed;with call bell/phone within reach;with bed alarm set Nurse Communication: Mobility status PT Visit Diagnosis: Difficulty in walking, not elsewhere classified (R26.2)     Time: 7121-9758 PT Time Calculation (min) (ACUTE ONLY): 27 min  Charges:  $Gait Training: 8-22 mins $Therapeutic Exercise: 8-22 mins $Therapeutic Activity: 8-22 mins                     Mauro Kaufmann PT Acute Rehabilitation Services Pager [phone] Office [phone]    Rayma Hegg 05/03/2019, 3:02 PM

## 2019-05-04 LAB — CBC
HCT: 25 % — ABNORMAL LOW (ref 36.0–46.0)
Hemoglobin: 7.7 g/dL — ABNORMAL LOW (ref 12.0–15.0)
MCH: 29.2 pg (ref 26.0–34.0)
MCHC: 30.8 g/dL (ref 30.0–36.0)
MCV: 94.7 fL (ref 80.0–100.0)
Platelets: 169 10*3/uL (ref 150–400)
RBC: 2.64 MIL/uL — ABNORMAL LOW (ref 3.87–5.11)
RDW: 13.8 % (ref 11.5–15.5)
WBC: 10.7 10*3/uL — ABNORMAL HIGH (ref 4.0–10.5)
nRBC: 0 % (ref 0.0–0.2)

## 2019-05-04 LAB — BASIC METABOLIC PANEL
Anion gap: 8 (ref 5–15)
BUN: 14 mg/dL (ref 6–20)
CO2: 23 mmol/L (ref 22–32)
Calcium: 7.9 mg/dL — ABNORMAL LOW (ref 8.9–10.3)
Chloride: 105 mmol/L (ref 98–111)
Creatinine, Ser: 0.53 mg/dL (ref 0.44–1.00)
GFR calc Af Amer: >60 mL/min (ref >60)
GFR calc non Af Amer: >60 mL/min (ref >60)
Glucose, Bld: 124 mg/dL — ABNORMAL HIGH (ref 70–99)
Potassium: 3.4 mmol/L — ABNORMAL LOW (ref 3.5–5.1)
Sodium: 136 mmol/L (ref 135–145)

## 2019-05-04 MED ORDER — POTASSIUM CHLORIDE CRYS ER 20 MEQ PO TBCR: 20.0000 meq | EXTENDED_RELEASE_TABLET | Freq: Two times a day (BID) | ORAL | Status: DC

## 2019-05-04 NOTE — Progress Notes (Signed)
Discharge and medication instructions reviewed with patuietn. Questions answered; patient denies further questions. BNo prescriptions given to patient. Spouse is en route to drive patient home. Lina Sar, RN

## 2019-05-04 NOTE — TOC Progression Note (Signed)
Transition of Care [facility]) - Progression Note    Patient Details  Name: Michelle Lloyd MRN: [id number] Date of Birth: [birth year]  [facility]) CM/SW Contact  Golda Acre, RN Phone Number: 05/04/2019, 9:36 AM  Clinical Narrative:    Charlyne Quale with [employer] concerning hhc wcb.        Expected Discharge Plan and Services           Expected Discharge Date: 05/04/19                                     Social Determinants of Health (SDOH) Interventions    Readmission Risk Interventions No flowsheet data found.

## 2019-05-04 NOTE — Progress Notes (Signed)
Physical Therapy Treatment Patient Details Name: Michelle Lloyd MRN: [id number] DOB: [birth year] Today's Date: 05/04/2019    History of Present Illness Pt s/p R THR and with hx of L THR 12/19, CHF, CHB, and cardiomyopathy    PT Comments    Pt very cooperative and able to complete stairs.  Reviewed therex and work on stretching postural correction.  Pt eager for dc home.   Follow Up Recommendations  Follow surgeon's recommendation for DC plan and follow-up therapies;Home health PT     Equipment Recommendations  Rolling walker with 5" wheels;3in1 (PT)    Recommendations for Other Services       Precautions / Restrictions Precautions Precautions: Fall Restrictions Weight Bearing Restrictions: No Other Position/Activity Restrictions: WBAT    Mobility  Bed Mobility Overal bed mobility: Needs Assistance Bed Mobility: Supine to Sit     Supine to sit: Supervision     General bed mobility comments: Pt up in recliner  Transfers Overall transfer level: Needs assistance Equipment used: Rolling walker (2 wheeled) Transfers: Sit to/from Stand Sit to Stand: Min assist;Min guard Stand pivot transfers: Min assist       General transfer comment: steady assist with cues for LE management and use of UEs to self assist  Ambulation/Gait Ambulation/Gait assistance: Min assist;Min guard Gait Distance (Feet): 75 Feet Assistive device: Rolling walker (2 wheeled) Gait Pattern/deviations: Step-to pattern;Decreased step length - right;Decreased step length - left;Shuffle;Trunk flexed Gait velocity: decr   General Gait Details: cues for posture, sequence and position from RW.  Several stops to work on regaining erect posture.   Stairs Stairs: Yes Stairs assistance: Min assist Stair Management: One rail Right;Step to pattern;Forwards;With cane Number of Stairs: 4 General stair comments: cues for sequence and foot/Cane placement   Wheelchair Mobility    Modified Rankin  (Stroke Patients Only)       Balance Overall balance assessment: Needs assistance Sitting-balance support: No upper extremity supported;Feet supported Sitting balance-Leahy Scale: Good     Standing balance support: Bilateral upper extremity supported Standing balance-Leahy Scale: Poor                              Cognition Arousal/Alertness: Awake/alert Behavior During Therapy: WFL for tasks assessed/performed Overall Cognitive Status: Within Functional Limits for tasks assessed                                        Exercises Total Joint Exercises Ankle Circles/Pumps: AROM;Both;20 reps;Supine Quad Sets: AROM;10 reps;Supine;Both Heel Slides: AAROM;Right;Supine;20 reps Hip ABduction/ADduction: AAROM;Right;Supine;15 reps Long Arc Quad: AROM;Right;10 reps;Seated    General Comments        Pertinent Vitals/Pain Pain Assessment: 0-10 Pain Score: 4  Pain Location: R hip with standing erect Pain Descriptors / Indicators: Aching;Sore Pain Intervention(s): Limited activity within patient's tolerance;Monitored during session;Premedicated before session    Home Living                      Prior Function            PT Goals (current goals can now be found in the care plan section) Acute Rehab PT Goals Patient Stated Goal: Resume previous lifestyle PT Goal Formulation: With patient Time For Goal Achievement: 05/10/19 Potential to Achieve Goals: Good Progress towards PT goals: Progressing toward goals    Frequency    7X/week  PT Plan Current plan remains appropriate    Co-evaluation              AM-PAC PT "6 Clicks" Mobility   Outcome Measure  Help needed turning from your back to your side while in a flat bed without using bedrails?: A Little Help needed moving from lying on your back to sitting on the side of a flat bed without using bedrails?: A Little Help needed moving to and from a bed to a chair (including  a wheelchair)?: A Little Help needed standing up from a chair using your arms (e.g., wheelchair or bedside chair)?: A Little Help needed to walk in hospital room?: A Little Help needed climbing 3-5 steps with a railing? : A Little 6 Click Score: 18    End of Session Equipment Utilized During Treatment: Gait belt Activity Tolerance: Patient tolerated treatment well;Patient limited by fatigue Patient left: in chair;with call bell/phone within reach Nurse Communication: Mobility status PT Visit Diagnosis: Difficulty in walking, not elsewhere classified (R26.2)     Time: 7939-0300 PT Time Calculation (min) (ACUTE ONLY): 17 min  Charges:  $Gait Training: 8-22 mins $Therapeutic Exercise: 8-22 mins $Therapeutic Activity: 8-22 mins                     Mauro Kaufmann PT Acute Rehabilitation Services Pager [phone] Office [phone]    Michelle Lloyd 05/04/2019, 11:58 AM

## 2019-05-04 NOTE — Progress Notes (Addendum)
Subjective: 2 Days Post-Op Procedure(s) (LRB): TOTAL HIP ARTHROPLASTY ANTERIOR APPROACH (Right) Patient reports pain as mild.   Patient seen in rounds with Dr. Lequita Halt. Patient is well, and has had no acute complaints or problems other than soreness in the right hip. She reports that she is feeling better than yesterday. No dizziness. Denies SOB and chest pain. Voiding well. Had BM yesterday evening. We will resume therapy today.  Plan is to go Home after hospital stay.  Objective: Vital signs in last 24 hours: Temp:  [97.8 F (36.6 C)-99.8 F (37.7 C)] 97.8 F (36.6 C) (05/29 0555) Pulse Rate:  [74-85] 85 (05/29 0555) Resp:  [16-20] 18 (05/29 0555) BP: (93-113)/(51-71) 113/60 (05/29 0555) SpO2:  [99 %-100 %] 99 % (05/29 0555)  Intake/Output from previous day:  Intake/Output Summary (Last 24 hours) at 05/04/2019 0722 Last data filed at 05/04/2019 0555 Gross per 24 hour  Intake 1093.94 ml  Output 1450 ml  Net -356.06 ml     Labs: Recent Labs    05/02/19 1556 05/03/19 0355 05/04/19 0347  HGB 8.8* 8.8* 7.7*   Recent Labs    05/03/19 0355 05/04/19 0347  WBC 12.4* 10.7*  RBC 2.99* 2.64*  HCT 28.4* 25.0*  PLT 214 169   Recent Labs    05/03/19 0355 05/04/19 0347  NA 138 136  K 3.9 3.4*  CL 106 105  CO2 22 23  BUN 14 14  CREATININE 0.53 0.53  GLUCOSE 120* 124*  CALCIUM 8.1* 7.9*    EXAM General - Patient is Alert and Oriented Extremity - Neurologically intact Intact pulses distally Dorsiflexion/Plantar flexion intact No cellulitis present Compartment soft Dressing - dressing C/D/I Motor Function - intact, moving foot and toes well on exam.   Past Medical History:  Diagnosis Date  . Allergy   . Anemia   . Bilateral primary osteoarthritis of hip   . Bilateral wrist pain   . Blood transfusion without reported diagnosis   . Cardiac device in situ 07/06/2018   Biotronik BIVICD , right chest  . CHB (complete heart block) (HCC)    History of   .  CHF (congestive heart failure) (HCC)   . Complication of anesthesia    hard to wake up at times in past  . H/O prosthetic aortic valve replacement   . Heart murmur   . History of bronchitis   . History of cardiac arrest 01/2017  . History of repair of coarctation of aorta   . History of sustained ventricular tachycardia   . History of ventricular fibrillation 01/2017  . Hyperlipidemia   . Hypertension   . Non-ischemic cardiomyopathy (HCC)   . Pneumonia   . PONV (postoperative nausea and vomiting)   . Presence of permanent cardiac pacemaker    right chest  . Systolic heart failure (HCC)   . Turner syndrome   . Wears hearing aid in both ears     Assessment/Plan: 2 Days Post-Op Procedure(s) (LRB): TOTAL HIP ARTHROPLASTY ANTERIOR APPROACH (Right) Active Problems:   OA (osteoarthritis) of hip  Estimated body mass index is 19.92 kg/m as calculated from the following:   Height as of this encounter: 5' (1.524 m).   Weight as of this encounter: 46.3 kg. Advance diet Up with therapy D/C IV fluids- saline lock  DVT Prophylaxis - Xarelto Weight Bearing As Tolerated   Continue with PT this morning. Will monitor BP closely. Continue iron supplementation for low Hgb. Continue potassium for hypokalemia. Possible DC home this afternoon if  she is meeting her goals and if there are no issue with hypotension and/or dizziness. Follow up in office in 2 weeks.    Dimitri Ped, PA-C Orthopaedic Surgery 05/04/2019, 7:22 AM

## 2019-05-04 NOTE — Progress Notes (Signed)
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  This session pt completed home therex program including stretching out in flat ly and while standing.  Written instruction provided.  Stairs and ambulation deferred to after pain meds.   Follow Up Recommendations  Follow surgeon's recommendation for DC plan and follow-up therapies;Home health PT     Equipment Recommendations  Rolling walker with 5" wheels;3in1 (PT)    Recommendations for Other Services       Precautions / Restrictions Precautions Precautions: Fall Restrictions Weight Bearing Restrictions: No Other Position/Activity Restrictions: WBAT    Mobility  Bed Mobility Overal bed mobility: Needs Assistance Bed Mobility: Supine to Sit     Supine to sit: Supervision     General bed mobility comments: INcreased time with use of UEs to assist R LE  Transfers Overall transfer level: Needs assistance Equipment used: Rolling walker (2 wheeled) Transfers: Sit to/from Stand Sit to Stand: Min assist Stand pivot transfers: Min assist       General transfer comment: steady assist with cues for LE management and use of UEs to self assist  Ambulation/Gait             General Gait Details: Pt up to chair only 2* increased pain with standing.  Pain meds requested.   Stairs             Wheelchair Mobility    Modified Rankin (Stroke Patients Only)       Balance Overall balance assessment: Needs assistance Sitting-balance support: No upper extremity supported;Feet supported Sitting balance-Leahy Scale: Good     Standing balance support: Bilateral upper extremity supported Standing balance-Leahy Scale: Poor                              Cognition  Arousal/Alertness: Awake/alert Behavior During Therapy: WFL for tasks assessed/performed Overall Cognitive Status: Within Functional Limits for tasks assessed                                        Exercises Total Joint Exercises Ankle Circles/Pumps: AROM;Both;20 reps;Supine Quad Sets: AROM;10 reps;Supine;Both Heel Slides: AAROM;Right;Supine;20 reps Hip ABduction/ADduction: AAROM;Right;Supine;15 reps Long Arc Quad: AROM;Right;10 reps;Seated    General Comments        Pertinent Vitals/Pain Pain Assessment: 0-10 Pain Score: 6  Pain Location: R hip with standing erect Pain Descriptors / Indicators: Aching;Sore Pain Intervention(s): Limited activity within patient's tolerance;Monitored during session;Patient requesting pain meds-RN notified;Ice applied    Home Living                      Prior Function            PT Goals (current goals can now be found in the care plan section) Acute Rehab PT Goals Patient Stated Goal: Resume previous lifestyle PT Goal Formulation: With patient Time For Goal Achievement: 05/10/19 Potential to Achieve Goals: Good Progress towards PT goals: Progressing toward goals    Frequency    7X/week      PT Plan Current plan remains appropriate    Co-evaluation  AM-PAC PT "6 Clicks" Mobility   Outcome Measure  Help needed turning from your back to your side while in a flat bed without using bedrails?: A Little Help needed moving from lying on your back to sitting on the side of a flat bed without using bedrails?: A Little Help needed moving to and from a bed to a chair (including a wheelchair)?: A Little Help needed standing up from a chair using your arms (e.g., wheelchair or bedside chair)?: A Little Help needed to walk in hospital room?: A Little Help needed climbing 3-5 steps with a railing? : A Lot 6 Click Score: 17    End of Session Equipment Utilized During Treatment: Gait belt Activity  Tolerance: Patient limited by fatigue;Patient limited by pain Patient left: in chair;with call bell/phone within reach Nurse Communication: Mobility status PT Visit Diagnosis: Difficulty in walking, not elsewhere classified (R26.2)     Time: 2072-1828 PT Time Calculation (min) (ACUTE ONLY): 34 min  Charges:  $Therapeutic Exercise: 8-22 mins $Therapeutic Activity: 8-22 mins                     Mauro Kaufmann PT Acute Rehabilitation Services Pager [phone] Office [phone]    Sonika Levins 05/04/2019, 11:50 AM

## 2019-05-04 NOTE — Discharge Summary (Signed)
Physician Discharge Summary   Patient ID: Michelle Lloyd MRN: [id number] DOB/AGE: [birth year] 55 y.o.  Admit date: 05/02/2019 Discharge date: 05/04/2019  Primary Diagnosis: Primary osteoarthritis right hip  Admission Diagnoses:  Past Medical History:  Diagnosis Date   Allergy    Anemia    Bilateral primary osteoarthritis of hip    Bilateral wrist pain    Blood transfusion without reported diagnosis    Cardiac device in situ 07/06/2018   B[place]ron[place] BIVICD , right chest   CHB (complete heart block) (HCC)    History of    CHF (congestive heart failure) (HCC)    Complication of anesthesia    hard to wake up at times in past   H/O prosthetic aortic valve replacement    Heart murmur    History of bronchitis    History of cardiac arrest 01/2017   History of repair of coarctation of aorta    History of sustained ventricular tachycardia    History of ventricular fibrillation 01/2017   Hyperlipidemia    Hypertension    Non-ischemic cardiomyopathy (HCC)    Pneumonia    PONV (postoperative nausea and vomiting)    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   Weight as of this encounter: 46.3 kg.  Procedure(s) (LRB): TOTAL HIP ARTHROPLASTY ANTERIOR APPROACH (Right)   Consults: None  HPI: Michelle Lloyd, 53 y.o. female, has a history of pain and functional disability in the right hip(s) due to arthritis and patient has failed non-surgical conservative treatments for greater than 12 weeks to include corticosteriod injections and activity modification.  Onset of symptoms was gradual starting 2 years ago with gradually worsening course since that time.The  patient noted no past surgery on the right hip(s).  Patient currently rates pain in the right hip at 9 out of 10 with activity. Patient has worsening of pain with activity and weight bearing, pain that interfers with activities of daily living and crepitus. 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 by imaging studies. This condition presents safety issues increasing the risk of falls. There is no current active infection.  Laboratory Data: Admission on 05/02/2019, Discharged on 05/04/2019  Component Date Value Ref Range Status   pH, Arterial 05/02/2019 7.437  7.350 - 7.450 Final   pCO2 arterial 05/02/2019 41.3  32.0 - 48.0 mmHg Final   pO2, Arterial 05/02/2019 276.0* 83.0 - 108.0 mmHg Final   Bicarbonate 05/02/2019 27.9  20.0 - 28.0 mmol/L Final   TCO2 05/02/2019 29  22 - 32 mmol/L Final   O2 Saturation 05/02/2019 100.0  % Final   Acid-Base Excess 05/02/2019 3.0* 0.0 - 2.0 mmol/L Final   Sodium 05/02/2019 136  135 - 145 mmol/L Final   Potassium 05/02/2019 3.8  3.5 - 5.1 mmol/L Final   Calcium, Ion 05/02/2019 1.14* 1.15 - 1.40 mmol/L Final   HCT 05/02/2019 26.0* 36.0 - 46.0 % Final   Hemoglobin 05/02/2019 8.8* 12.0 - 15.0 g/dL Final   Patient temperature 05/02/2019 36.8 C   Final   Sample type 05/02/2019 ARTERIAL   Final   WBC 05/03/2019 12.4* 4.0 - 10.5 K/uL  Final   RBC 05/03/2019 2.99* 3.87 - 5.11 MIL/uL Final   Hemoglobin 05/03/2019 8.8* 12.0 - 15.0 g/dL Final   HCT 93/81/8299 28.4* 36.0 - 46.0 % Final   MCV 05/03/2019 95.0  80.0 - 100.0 fL Final   MCH 05/03/2019 29.4  26.0 - 34.0 pg Final   MCHC 05/03/2019 31.0  30.0 - 36.0 g/dL Final   RDW 37/16/9678 13.6  11.5 - 15.5 % Final   Platelets 05/03/2019 214  150 - 400 K/uL Final   nRBC 05/03/2019 0.0  0.0 - 0.2 % Final   Performed at [facility], [address], [place], [place] [address]   Sodium 05/03/2019 138  135 - 145 mmol/L Final   Potassium  05/03/2019 3.9  3.5 - 5.1 mmol/L Final   Chloride 05/03/2019 106  98 - 111 mmol/L Final   CO2 05/03/2019 22  22 - 32 mmol/L Final   Glucose, Bld 05/03/2019 120* 70 - 99 mg/dL Final   BUN 17/51/0258 14  6 - 20 mg/dL Final   Creatinine, Ser 05/03/2019 0.53  0.44 - 1.00 mg/dL Final   Calcium 52/77/8242 8.1* 8.9 - 10.3 mg/dL Final   GFR calc non Af Amer 05/03/2019 >60  >60 mL/min Final   GFR calc Af Amer 05/03/2019 >60  >60 mL/min Final   Anion gap 05/03/2019 10  5 - 15 Final   Performed at [facility], [address], [place], [place] [address]   WBC 05/04/2019 10.7* 4.0 - 10.5 K/uL Final   RBC 05/04/2019 2.64* 3.87 - 5.11 MIL/uL Final   Hemoglobin 05/04/2019 7.7* 12.0 - 15.0 g/dL Final   HCT 44/31/5400 25.0* 36.0 - 46.0 % Final   MCV 05/04/2019 94.7  80.0 - 100.0 fL Final   MCH 05/04/2019 29.2  26.0 - 34.0 pg Final   MCHC 05/04/2019 30.8  30.0 - 36.0 g/dL Final   RDW 86/76/1950 13.8  11.5 - 15.5 % Final   Platelets 05/04/2019 169  150 - 400 K/uL Final   nRBC 05/04/2019 0.0  0.0 - 0.2 % Final   Performed at [facility], [address], [place], [place] [address]   Sodium 05/04/2019 136  135 - 145 mmol/L Final   Potassium 05/04/2019 3.4* 3.5 - 5.1 mmol/L Final   Chloride 05/04/2019 105  98 - 111 mmol/L Final   CO2 05/04/2019 23  22 - 32 mmol/L Final   Glucose, Bld 05/04/2019 124* 70 - 99 mg/dL Final   BUN 12/45/8099 14  6 - 20 mg/dL Final   Creatinine, Ser 05/04/2019 0.53  0.44 - 1.00 mg/dL Final   Calcium 83/38/2505 7.9* 8.9 - 10.3 mg/dL Final   GFR calc non Af Amer 05/04/2019 >60  >60 mL/min Final   GFR calc Af Amer 05/04/2019 >60  >60 mL/min Final   Anion gap 05/04/2019 8  5 - 15 Final   Performed at [facility], [address] [place], [place] [address]  Hospital Outpatient Visit on 04/27/2019  Component Date Value Ref Range Status   SARS-CoV-2, NAA 04/27/2019 NOT DETECTED  NOT DETECTED  Final   Comment: (NOTE) Testing was performed using the cobas(R) SARS-CoV-2 test. This test was developed and its performance characteristics determined by [employer]. This test has not been FDA cleared or approved. This test has been authorized by FDA under an Emergency Use Authorization (EUA). This test is only authorized for the duration of time the declaration that circumstances exist justifying the authorization  of the emergency use of in vitro diagnostic tests for detection of SARS-CoV-2 virus and/or diagnosis of COVID-19 infection under section 564(b)(1) of the Act, 21 U.S.C. 409WJX-9(J)(4), unless the authorization is terminated or revoked sooner. When diagnostic testing is negative, the possibility of a false negative result should be considered in the context of a patient's recent exposures and the presence of clinical signs and symptoms consistent with COVID-19. An individual without symptoms of COVID-19 and who is not shedding SARS-CoV-2 virus would expect to have                           a negative (not detected) result in this assay. Performed At: [facility] [address] [place], [place] [id number] Jolene Schimke MD YQ:[id number]    Coronavirus Source 04/27/2019 NASOPHARYNGEAL   Final   Performed at [facility], [address], [place], [place] [address]  Hospital Outpatient Visit on 04/27/2019  Component Date Value Ref Range Status   aPTT 04/27/2019 33  24 - 36 seconds Final   Performed at [facility], [address], [place], [place] [address]   WBC 04/27/2019 6.7  4.0 - 10.5 K/uL Final   RBC 04/27/2019 3.54* 3.87 - 5.11 MIL/uL Final   Hemoglobin 04/27/2019 10.4* 12.0 - 15.0 g/dL Final   HCT 10/02/2535 33.5* 36.0 - 46.0 % Final   MCV 04/27/2019 94.6  80.0 - 100.0 fL Final   MCH 04/27/2019 29.4  26.0 - 34.0 pg Final   MCHC 04/27/2019 31.0  30.0 - 36.0 g/dL Final   RDW 64/40/3474 14.3  11.5 - 15.5 % Final     Platelets 04/27/2019 241  150 - 400 K/uL Final   nRBC 04/27/2019 0.0  0.0 - 0.2 % Final   Performed at [facility], [address], [place], [place] [address]   Sodium 04/27/2019 138  135 - 145 mmol/L Final   Potassium 04/27/2019 4.2  3.5 - 5.1 mmol/L Final   Chloride 04/27/2019 102  98 - 111 mmol/L Final   CO2 04/27/2019 26  22 - 32 mmol/L Final   Glucose, Bld 04/27/2019 91  70 - 99 mg/dL Final   BUN 38/75/6433 18  6 - 20 mg/dL Final   Creatinine, Ser 04/27/2019 0.58  0.44 - 1.00 mg/dL Final   Calcium 29/51/8841 9.1  8.9 - 10.3 mg/dL Final   Total Protein 66/05/3015 7.3  6.5 - 8.1 g/dL Final   Albumin 12/14/3233 3.7  3.5 - 5.0 g/dL Final   AST 57/32/2025 21  15 - 41 U/L Final   ALT 04/27/2019 18  0 - 44 U/L Final   Alkaline Phosphatase 04/27/2019 110  38 - 126 U/L Final   Total Bilirubin 04/27/2019 0.3  0.3 - 1.2 mg/dL Final   GFR calc non Af Amer 04/27/2019 >60  >60 mL/min Final   GFR calc Af Amer 04/27/2019 >60  >60 mL/min Final   Anion gap 04/27/2019 10  5 - 15 Final   Performed at [facility], [address] [place], [place] [address]   Prothrombin Time 04/27/2019 12.8  11.4 - 15.2 seconds Final   INR 04/27/2019 1.0  0.8 - 1.2 Final   Comment: (NOTE) INR goal varies based on device and disease states. Performed at [facility], [address], [place], [place] [address]    ABO/RH(D) 04/27/2019 AB POS   Final   Antibody Screen 04/27/2019 NEG  Final   Sample Expiration 04/27/2019 05/05/2019,2359   Final   Extend sample reason 04/27/2019    Final                   Value:NO TRANSFUSIONS OR PREGNANCY IN THE PAST 3 MONTHS Performed at [facility], [address] [place], [place] [address]    MRSA, PCR 04/27/2019 NEGATIVE  NEGATIVE Final   Staphylococcus aureus 04/27/2019 POSITIVE* NEGATIVE Final   Comment: (NOTE) The Xpert SA Assay (FDA approved for NASAL specimens in  patients 37 years of age and older), is one component of a comprehensive surveillance program. It is not intended to diagnose infection nor to guide or monitor treatment. Performed at [facility], [address] [place], [place] [address]      X-Rays:Dg Pelvis Portable  Result Date: 05/02/2019 CLINICAL DATA:  Right total hip arthroplasty EXAM: PORTABLE PELVIS 1-2 VIEWS COMPARISON:  11/27/2018 pelvic radiograph FINDINGS: Interval right total hip arthroplasty with well-positioned right acetabular and proximal right femoral prostheses. Prior left total hip arthroplasty. No evidence of hip dislocation on this single frontal view. Surgical drain terminates lateral to the right hip. Expected postoperative soft tissue gas surrounding the right hip. No acute osseous fracture. No focal osseous lesions. IMPRESSION: Satisfactory immediate postoperative appearance status post right total hip arthroplasty. Electronically Signed   By: Delbert Phenix M.D.   On: 05/02/2019 18:36   Dg C-arm 1-60 Min-no Report  Result Date: 05/02/2019 Fluoroscopy was utilized by the requesting physician.  No radiographic interpretation.   Dg Hip Operative Unilat With Pelvis Right  Result Date: 05/02/2019 CLINICAL DATA:  Total right hip arthroplasty EXAM: OPERATIVE right HIP (WITH PELVIS IF PERFORMED) 4 VIEWS TECHNIQUE: Fluoroscopic spot image(s) were submitted for interpretation post-operatively. COMPARISON:  11/27/2018 FINDINGS: Severe degenerative changes involving the right hip. Subsequent placement of a total right hip arthroplasty with well seated femoral and acetabular components without complicating features. Prior left total hip arthroplasty is noted. IMPRESSION: Well seated components of a total right hip arthroplasty without complicating features. Electronically Signed   By: Rudie Meyer M.D.   On: 05/02/2019 16:29    EKG: Orders placed or performed in visit on 11/01/18   EKG 12-Lead      Hospital Course: Patient was admitted to [facility] and taken to the OR and underwent the above state procedure without complications.  Patient tolerated the procedure well and was later transferred to the recovery room and then to the orthopaedic floor for postoperative care.  They were given PO and IV analgesics for pain control following their surgery.  They were given 24 hours of postoperative antibiotics of  Anti-infectives (From admission, onward)   Start     Dose/Rate Route Frequency Ordered Stop   05/02/19 2000  ceFAZolin (ANCEF) IVPB 1 g/50 mL premix     1 g 100 mL/hr over 30 Minutes Intravenous Every 6 hours 05/02/19 1811 05/03/19 0323   05/02/19 1200  ceFAZolin (ANCEF) IVPB 2g/100 mL premix     2 g 200 mL/hr over 30 Minutes Intravenous On call to O.R. 05/02/19 1150 05/02/19 1408     and started on DVT prophylaxis in the form of Xarelto.   PT and OT were ordered for total hip protocol.  The patient was allowed to be WBAT with therapy. Discharge planning was consulted to help with postop disposition and equipment needs.  Patient had a fair night on the evening of surgery.  They started to get up  OOB with therapy on day one.  Hemovac drain was pulled without difficulty. Had some issues with hypotension on post op day one but improved with bolus. Continued to work with therapy into day two.  Dressing was changed on day two and the incision was clean and dry.  The patient had progressed with therapy and meeting their goals.  Incision was healing well.  Patient was seen in rounds and was ready to go home.  Diet: Cardiac diet Activity:WBAT Follow-up:in 2 weeks Disposition - Home Discharged Condition: stable   Discharge Instructions    Call MD / Call 911   Complete by:  As directed    If you experience chest pain or shortness of breath, CALL 911 and be transported to the hospital emergency room.  If you develope a fever above 101 F, pus (white drainage) or increased drainage or  redness at the wound, or calf pain, call your surgeon's office.   Call MD / Call 911   Complete by:  As directed    If you experience chest pain or shortness of breath, CALL 911 and be transported to the hospital emergency room.  If you develope a fever above 101 F, pus (white drainage) or increased drainage or redness at the wound, or calf pain, call your surgeon's office.   Change dressing   Complete by:  As directed    You may change your dressing on Friday, then change the dressing daily with sterile 4 x 4 inch gauze dressing and paper tape.   Constipation Prevention   Complete by:  As directed    Drink plenty of fluids.  Prune juice may be helpful.  You may use a stool softener, such as Colace (over the counter) 100 mg twice a day.  Use MiraLax (over the counter) for constipation as needed.   Constipation Prevention   Complete by:  As directed    Drink plenty of fluids.  Prune juice may be helpful.  You may use a stool softener, such as Colace (over the counter) 100 mg twice a day.  Use MiraLax (over the counter) for constipation as needed.   Diet - low sodium heart healthy   Complete by:  As directed    Diet - low sodium heart healthy   Complete by:  As directed    Discharge instructions   Complete by:  As directed    Dr. Ollen Gross Total Joint Specialist [facility] [address] [place], [place] [address] [phone]  ANTERIOR APPROACH TOTAL HIP REPLACEMENT POSTOPERATIVE DIRECTIONS   Hip Rehabilitation, Guidelines Following Surgery  The results of a hip operation are greatly improved after range of motion and muscle strengthening exercises. Follow all safety measures which are given to protect your hip. If any of these exercises cause increased pain or swelling in your joint, decrease the amount until you are comfortable again. Then slowly increase the exercises. Call your caregiver if you have problems or questions.   HOME CARE INSTRUCTIONS  Remove items at  home which could result in a fall. This includes throw rugs or furniture in walking pathways.  ICE to the affected hip every three hours for 30 minutes at a time and then as needed for pain and swelling.  Continue to use ice on the hip for pain and swelling from surgery. You may notice swelling that will progress down to the foot and ankle.  This is normal after surgery.  Elevate the leg when you are not up walking on  it.   Continue to use the breathing machine which will help keep your temperature down.  It is common for your temperature to cycle up and down following surgery, especially at night when you are not up moving around and exerting yourself.  The breathing machine keeps your lungs expanded and your temperature down.  DIET You may resume your previous home diet once your are discharged from the hospital.  DRESSING / WOUND CARE / SHOWERING You may change your dressing 3-5 days after surgery.  Then change the dressing every day with sterile gauze.  Please use good hand washing techniques before changing the dressing.  Do not use any lotions or creams on the incision until instructed by your surgeon. You may start showering once you are discharged home but do not submerge the incision under water. Just pat the incision dry and apply a dry gauze dressing on daily. Change the surgical dressing daily and reapply a dry dressing each time.  ACTIVITY Walk with your walker as instructed. Use walker as long as suggested by your caregivers. Avoid periods of inactivity such as sitting longer than an hour when not asleep. This helps prevent blood clots.  You may resume a sexual relationship in one month or when given the OK by your doctor.  You may return to work once you are cleared by your doctor.  Do not drive a car for 6 weeks or until released by you surgeon.  Do not drive while taking narcotics.  WEIGHT BEARING Weight bearing as tolerated with assist device (walker, cane, etc) as directed,  use it as long as suggested by your surgeon or therapist, typically at least 4-6 weeks.  POSTOPERATIVE CONSTIPATION PROTOCOL Constipation - defined medically as fewer than three stools per week and severe constipation as less than one stool per week.  One of the most common issues patients have following surgery is constipation.  Even if you have a regular bowel pattern at home, your normal regimen is likely to be disrupted due to multiple reasons following surgery.  Combination of anesthesia, postoperative narcotics, change in appetite and fluid intake all can affect your bowels.  In order to avoid complications following surgery, here are some recommendations in order to help you during your recovery period.  Colace (docusate) - Pick up an over-the-counter form of Colace or another stool softener and take twice a day as long as you are requiring postoperative pain medications.  Take with a full glass of water daily.  If you experience loose stools or diarrhea, hold the colace until you stool forms back up.  If your symptoms do not get better within 1 week or if they get worse, check with your doctor.  Dulcolax (bisacodyl) - Pick up over-the-counter and take as directed by the product packaging as needed to assist with the movement of your bowels.  Take with a full glass of water.  Use this product as needed if not relieved by Colace only.   MiraLax (polyethylene glycol) - Pick up over-the-counter to have on hand.  MiraLax is a solution that will increase the amount of water in your bowels to assist with bowel movements.  Take as directed and can mix with a glass of water, juice, soda, coffee, or tea.  Take if you go more than two days without a movement. Do not use MiraLax more than once per day. Call your doctor if you are still constipated or irregular after using this medication for 7 days in a row.  If you continue to have problems with postoperative constipation, please contact the office for  further assistance and recommendations.  If you experience "the worst abdominal pain ever" or develop nausea or vomiting, please contact the office immediatly for further recommendations for treatment.  ITCHING  If you experience itching with your medications, try taking only a single pain pill, or even half a pain pill at a time.  You can also use Benadryl over the counter for itching or also to help with sleep.   TED HOSE STOCKINGS Wear the elastic stockings on both legs for three weeks following surgery during the day but you may remove then at night for sleeping.  MEDICATIONS See your medication summary on the "After Visit Summary" that the nursing staff will review with you prior to discharge.  You may have some home medications which will be placed on hold until you complete the course of blood thinner medication.  It is important for you to complete the blood thinner medication as prescribed by your surgeon.  Continue your approved medications as instructed at time of discharge.  PRECAUTIONS If you experience chest pain or shortness of breath - call 911 immediately for transfer to the hospital emergency department.  If you develop a fever greater that 101 F, purulent drainage from wound, increased redness or drainage from wound, foul odor from the wound/dressing, or calf pain - CONTACT YOUR SURGEON.                                                   FOLLOW-UP APPOINTMENTS Make sure you keep all of your appointments after your operation with your surgeon and caregivers. You should call the office at the above phone number and make an appointment for approximately two weeks after the date of your surgery or on the date instructed by your surgeon outlined in the "After Visit Summary".  RANGE OF MOTION AND STRENGTHENING EXERCISES  These exercises are designed to help you keep full movement of your hip joint. Follow your caregiver's or physical therapist's instructions. Perform all exercises  about fifteen times, three times per day or as directed. Exercise both hips, even if you have had only one joint replacement. These exercises can be done on a training (exercise) mat, on the floor, on a table or on a bed. Use whatever works the best and is most comfortable for you. Use music or television while you are exercising so that the exercises are a pleasant break in your day. This will make your life better with the exercises acting as a break in routine you can look forward to.  Lying on your back, slowly slide your foot toward your buttocks, raising your knee up off the floor. Then slowly slide your foot back down until your leg is straight again.  Lying on your back spread your legs as far apart as you can without causing discomfort.  Lying on your side, raise your upper leg and foot straight up from the floor as far as is comfortable. Slowly lower the leg and repeat.  Lying on your back, tighten up the muscle in the front of your thigh (quadriceps muscles). You can do this by keeping your leg straight and trying to raise your heel off the floor. This helps strengthen the largest muscle supporting your knee.  Lying on your back, tighten up the  muscles of your buttocks both with the legs straight and with the knee bent at a comfortable angle while keeping your heel on the floor.   IF YOU ARE TRANSFERRED TO A SKILLED REHAB FACILITY If the patient is transferred to a skilled rehab facility following release from the hospital, a list of the current medications will be sent to the facility for the patient to continue.  When discharged from the skilled rehab facility, please have the facility set up the patient's Home Health Physical Therapy prior to being released. Also, the skilled facility will be responsible for providing the patient with their medications at time of release from the facility to include their pain medication, the muscle relaxants, and their blood thinner medication. If the patient  is still at the rehab facility at time of the two week follow up appointment, the skilled rehab facility will also need to assist the patient in arranging follow up appointment in our office and any transportation needs.  MAKE SURE YOU:  Understand these instructions.  Get help right away if you are not doing well or get worse.    Pick up stool softner and laxative for home use following surgery while on pain medications. Do not submerge incision under water. Please use good hand washing techniques while changing dressing each day. May shower starting three days after surgery. Please use a clean towel to pat the incision dry following showers. Continue to use ice for pain and swelling after surgery. Do not use any lotions or creams on the incision until instructed by your surgeon.   Discharge instructions   Complete by:  As directed    Dr. Ollen Gross Total Joint Specialist [facility] [address] [place], [place] [address] [phone]  ANTERIOR APPROACH TOTAL HIP REPLACEMENT POSTOPERATIVE DIRECTIONS   Hip Rehabilitation, Guidelines Following Surgery  The results of a hip operation are greatly improved after range of motion and muscle strengthening exercises. Follow all safety measures which are given to protect your hip. If any of these exercises cause increased pain or swelling in your joint, decrease the amount until you are comfortable again. Then slowly increase the exercises. Call your caregiver if you have problems or questions.   HOME CARE INSTRUCTIONS  Remove items at home which could result in a fall. This includes throw rugs or furniture in walking pathways.  ICE to the affected hip every three hours for 30 minutes at a time and then as needed for pain and swelling.  Continue to use ice on the hip for pain and swelling from surgery. You may notice swelling that will progress down to the foot and ankle.  This is normal after surgery.  Elevate the leg when  you are not up walking on it.   Continue to use the breathing machine which will help keep your temperature down.  It is common for your temperature to cycle up and down following surgery, especially at night when you are not up moving around and exerting yourself.  The breathing machine keeps your lungs expanded and your temperature down.  DIET You may resume your previous home diet once your are discharged from the hospital.  DRESSING / WOUND CARE / SHOWERING You may change your dressing 3-5 days after surgery.  Then change the dressing every day with sterile gauze.  Please use good hand washing techniques before changing the dressing.  Do not use any lotions or creams on the incision until instructed by your surgeon. You may start  showering once you are discharged home but do not submerge the incision under water. Just pat the incision dry and apply a dry gauze dressing on daily. Change the surgical dressing daily and reapply a dry dressing each time.  ACTIVITY Walk with your walker as instructed. Use walker as long as suggested by your caregivers. Avoid periods of inactivity such as sitting longer than an hour when not asleep. This helps prevent blood clots.  You may resume a sexual relationship in one month or when given the OK by your doctor.  You may return to work once you are cleared by your doctor.  Do not drive a car for 6 weeks or until released by you surgeon.  Do not drive while taking narcotics.  WEIGHT BEARING Weight bearing as tolerated with assist device (walker, cane, etc) as directed, use it as long as suggested by your surgeon or therapist, typically at least 4-6 weeks.  POSTOPERATIVE CONSTIPATION PROTOCOL Constipation - defined medically as fewer than three stools per week and severe constipation as less than one stool per week.  One of the most common issues patients have following surgery is constipation.  Even if you have a regular bowel pattern at home, your normal  regimen is likely to be disrupted due to multiple reasons following surgery.  Combination of anesthesia, postoperative narcotics, change in appetite and fluid intake all can affect your bowels.  In order to avoid complications following surgery, here are some recommendations in order to help you during your recovery period.  Colace (docusate) - Pick up an over-the-counter form of Colace or another stool softener and take twice a day as long as you are requiring postoperative pain medications.  Take with a full glass of water daily.  If you experience loose stools or diarrhea, hold the colace until you stool forms back up.  If your symptoms do not get better within 1 week or if they get worse, check with your doctor.  Dulcolax (bisacodyl) - Pick up over-the-counter and take as directed by the product packaging as needed to assist with the movement of your bowels.  Take with a full glass of water.  Use this product as needed if not relieved by Colace only.   MiraLax (polyethylene glycol) - Pick up over-the-counter to have on hand.  MiraLax is a solution that will increase the amount of water in your bowels to assist with bowel movements.  Take as directed and can mix with a glass of water, juice, soda, coffee, or tea.  Take if you go more than two days without a movement. Do not use MiraLax more than once per day. Call your doctor if you are still constipated or irregular after using this medication for 7 days in a row.  If you continue to have problems with postoperative constipation, please contact the office for further assistance and recommendations.  If you experience "the worst abdominal pain ever" or develop nausea or vomiting, please contact the office immediatly for further recommendations for treatment.  ITCHING  If you experience itching with your medications, try taking only a single pain pill, or even half a pain pill at a time.  You can also use Benadryl over the counter for itching or also  to help with sleep.   TED HOSE STOCKINGS Wear the elastic stockings on both legs for three weeks following surgery during the day but you may remove then at night for sleeping.  MEDICATIONS See your medication summary on the "After Visit Summary" that  the nursing staff will review with you prior to discharge.  You may have some home medications which will be placed on hold until you complete the course of blood thinner medication.  It is important for you to complete the blood thinner medication as prescribed by your surgeon.  Continue your approved medications as instructed at time of discharge.  PRECAUTIONS If you experience chest pain or shortness of breath - call 911 immediately for transfer to the hospital emergency department.  If you develop a fever greater that 101 F, purulent drainage from wound, increased redness or drainage from wound, foul odor from the wound/dressing, or calf pain - CONTACT YOUR SURGEON.                                                   FOLLOW-UP APPOINTMENTS Make sure you keep all of your appointments after your operation with your surgeon and caregivers. You should call the office at the above phone number and make an appointment for approximately two weeks after the date of your surgery or on the date instructed by your surgeon outlined in the "After Visit Summary".  RANGE OF MOTION AND STRENGTHENING EXERCISES  These exercises are designed to help you keep full movement of your hip joint. Follow your caregiver's or physical therapist's instructions. Perform all exercises about fifteen times, three times per day or as directed. Exercise both hips, even if you have had only one joint replacement. These exercises can be done on a training (exercise) mat, on the floor, on a table or on a bed. Use whatever works the best and is most comfortable for you. Use music or television while you are exercising so that the exercises are a pleasant break in your day. This will make  your life better with the exercises acting as a break in routine you can look forward to.  Lying on your back, slowly slide your foot toward your buttocks, raising your knee up off the floor. Then slowly slide your foot back down until your leg is straight again.  Lying on your back spread your legs as far apart as you can without causing discomfort.  Lying on your side, raise your upper leg and foot straight up from the floor as far as is comfortable. Slowly lower the leg and repeat.  Lying on your back, tighten up the muscle in the front of your thigh (quadriceps muscles). You can do this by keeping your leg straight and trying to raise your heel off the floor. This helps strengthen the largest muscle supporting your knee.  Lying on your back, tighten up the muscles of your buttocks both with the legs straight and with the knee bent at a comfortable angle while keeping your heel on the floor.   IF YOU ARE TRANSFERRED TO A SKILLED REHAB FACILITY If the patient is transferred to a skilled rehab facility following release from the hospital, a list of the current medications will be sent to the facility for the patient to continue.  When discharged from the skilled rehab facility, please have the facility set up the patient's Home Health Physical Therapy prior to being released. Also, the skilled facility will be responsible for providing the patient with their medications at time of release from the facility to include their pain medication, the muscle relaxants, and their blood thinner medication. If  the patient is still at the rehab facility at time of the two week follow up appointment, the skilled rehab facility will also need to assist the patient in arranging follow up appointment in our office and any transportation needs.  MAKE SURE YOU:  Understand these instructions.  Get help right away if you are not doing well or get worse.    Pick up stool softner and laxative for home use following  surgery while on pain medications. Do not submerge incision under water. Please use good hand washing techniques while changing dressing each day. May shower starting three days after surgery. Please use a clean towel to pat the incision dry following showers. Continue to use ice for pain and swelling after surgery. Do not use any lotions or creams on the incision until instructed by your surgeon.  Information on my medicine - XARELTO (Rivaroxaban)  This medication education was reviewed with me or my healthcare representative as part of my discharge preparation.  The pharmacist that spoke with me during my hospital stay was:  WOFFORD, DREW A, RPH  Why was Xarelto prescribed for you? Xarelto was prescribed for you to reduce the risk of blood clots forming after orthopedic surgery. The medical term for these abnormal blood clots is venous thromboembolism (VTE).  What do you need to know about xarelto ? Take your Xarelto ONCE DAILY at the same time every day. You may take it either with or without food.  If you have difficulty swallowing the tablet whole, you may crush it and mix in applesauce just prior to taking your dose.  Take Xarelto exactly as prescribed by your doctor and DO NOT stop taking Xarelto without talking to the doctor who prescribed the medication.  Stopping without other VTE prevention medication to take the place of Xarelto may increase your risk of developing a clot.  After discharge, you should have regular check-up appointments with your healthcare provider that is prescribing your Xarelto.    What do you do if you miss a dose? If you miss a dose, take it as soon as you remember on the same day then continue your regularly scheduled once daily regimen the next day. Do not take two doses of Xarelto on the same day.   Important Safety Information A possible side effect of Xarelto is bleeding. You should call your healthcare provider right away if you  experience any of the following: Bleeding from an injury or your nose that does not stop. Unusual colored urine (red or dark brown) or unusual colored stools (red or black). Unusual bruising for unknown reasons. A serious fall or if you hit your head (even if there is no bleeding).  Some medicines may interact with Xarelto and might increase your risk of bleeding while on Xarelto. To help avoid this, consult your healthcare provider or pharmacist prior to using any new prescription or non-prescription medications, including herbals, vitamins, non-steroidal anti-inflammatory drugs (NSAIDs) and supplements.  This website has more information on Xarelto: VisitDestination.com.br.   Do not sit on low chairs, stoools or toilet seats, as it may be difficult to get up from low surfaces   Complete by:  As directed    Driving restrictions   Complete by:  As directed    No driving for two weeks   Increase activity slowly as tolerated   Complete by:  As directed    TED hose   Complete by:  As directed    Use stockings (TED hose) for three weeks  on both leg(s).  You may remove them at night for sleeping.   Weight bearing as tolerated   Complete by:  As directed      Allergies as of 05/04/2019      Reactions   Lisinopril Nausea Only      Medication List    STOP taking these medications   aspirin 81 MG chewable tablet   CO Q 10 PO   Magnesium 400 MG Caps   multivitamin with minerals tablet   OVER THE COUNTER MEDICATION   vitamin C 500 MG tablet Commonly known as:  ASCORBIC ACID     TAKE these medications   acetaminophen 650 MG CR tablet Commonly known as:  TYLENOL Take 650 mg by mouth daily.   ferrous sulfate 325 (65 FE) MG tablet Take 325 mg by mouth daily with breakfast.   furosemide 20 MG tablet Commonly known as:  LASIX Take 20 mg by mouth daily.   HYDROcodone-acetaminophen 5-325 MG tablet Commonly known as:  NORCO/VICODIN Take 1-2 tablets by mouth every 6 (six) hours as  needed for severe pain.   losartan 25 MG tablet Commonly known as:  COZAAR Take 1 tablet (25 mg total) by mouth daily.   methocarbamol 500 MG tablet Commonly known as:  ROBAXIN Take 1 tablet (500 mg total) by mouth every 6 (six) hours as needed for muscle spasms.   metoprolol succinate 25 MG 24 hr tablet Commonly known as:  TOPROL-XL Take 1 tablet (25 mg total) by mouth daily.   Potassium 99 MG Tabs Take 99 mg by mouth daily.   rivaroxaban 10 MG Tabs tablet Commonly known as:  XARELTO Take 1 tablet (10 mg total) by mouth daily with breakfast for 20 days. Then resume one 81 mg aspirin once a day.   traMADol 50 MG tablet Commonly known as:  ULTRAM Take 1-2 tablets (50-100 mg total) by mouth every 6 (six) hours as needed for moderate pain. What changed:    when to take this  reasons to take this            Durable Medical Equipment  (From admission, onward)         Start     Ordered   05/03/19 1059  For home use only DME Bedside commode  Once    Question:  Patient needs a bedside commode to treat with the following condition  Answer:  H/O total knee replacement   05/03/19 1059   05/03/19 1058  For home use only DME Walker rolling  Once    Comments:  Youth  Question:  Patient needs a walker to treat with the following condition  Answer:  H/O total knee replacement   05/03/19 1058           Discharge Care Instructions  (From admission, onward)         Start     Ordered   05/03/19 0000  Weight bearing as tolerated     05/03/19 0750   05/03/19 0000  Change dressing    Comments:  You may change your dressing on Friday, then change the dressing daily with sterile 4 x 4 inch gauze dressing and paper tape.   05/03/19 0750         Follow-up Information    Ollen GrossAluisio, Frank, MD. Schedule an appointment as soon as possible for a visit on 05/15/2019.   Specialty:  Orthopedic Surgery Contact information: [address] [place]  [id number] [phone]  Signed: Dimitri Ped, PA-C Orthopaedic Surgery 05/04/2019, 1:10 PM

## 2019-06-04 ENCOUNTER — Other Ambulatory Visit: Payer: Self-pay | Admitting: Cardiovascular Disease

## 2019-08-16 ENCOUNTER — Other Ambulatory Visit: Payer: Self-pay | Admitting: Cardiovascular Disease

## 2019-08-27 ENCOUNTER — Encounter: Payer: Self-pay | Admitting: Physician Assistant

## 2019-08-27 ENCOUNTER — Telehealth: Payer: Self-pay | Admitting: Cardiovascular Disease

## 2019-08-27 NOTE — Progress Notes (Signed)
Cardiology Office Note    Date:  08/30/2019   ID:  Michelle Lloyd, [place] 15-Jan-1966, MRN [id number]  PCP:  Rutherford Guys, MD  Cardiologist:  Skeet Latch, MD but reports that she needs to switch to Dr. Meda Coffee since Dr. Oval Linsey is not in network (Dr. Meda Coffee routed to admin to help sort out) Electrophysiologist:  None   Chief Complaint: 6 month f/u chronic combined CHF, coarctation repair, bicuspid AV replacement  History of Present Illness:   Michelle Lloyd is a 53 y.o. female with history of chronic systolic and diastolic heart failure, Turner syndrome, repair of coarctation of the aorta 1980, bicuspid aortic valve status post aortic valve replacement, ventricular tachycardia s/p ICD, hypertension, pulmonary nodules who presents for follow-up.  History is outlined in Dr. Blenda Mounts note from 04/2019 and also derived from [facility]. Ms. Rocco underwent repair of coarctation of the aorta in 1980 with a Gortex graft between descending aorta and left subclavian artery.She developed nonischemic cardiomyopathy. She had a bicuspid aortic valve and underwent bioprosthetic AVR in 2012. 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 She had sustained VT/VF arrest 02/2011 one week after AVR. It is mentioned this possibly occurred in 2018 but she refutes this. She had a biventricular ICD placed 02/2011. She had an RV lead fracture and a left innominate vein occlusion in 2016.She underwent laser lead extraction and she subsequently underwent implantation of a right-sided Biotronik ICD 06/2015. Her LVEF improved to 50% despite downgrade to a single lead ICD. She had a device infection 09/2017.Her ICD was removed and replaced at [facility].She had a repeat echo 11/2017 that reportedly showed a reduction in her LVEF to 30%. She then had an echoat [facility] 05/2018 that revealed LVEF 30% with global hypokinesis with exception of normal wall motion in the  posterior myocardium and akinesis of the septum. She underwent BiV upgrade 07/2018. Her device is on the right. At her post op device check the LV lead was not capturing so she had a lead revision 09/02/18.She was due to have a repeat echo at [facility] in 06/2019 but I do not see this occurred yet. Most recent labs 04/2019 showed K 3.4, Cr 0.53, Hgb 7.7 (in setting of hip replacement), LFTs 02/2019 wnl, 02/2018 LDL 131 (followed by PCP). In 10/2017 she had a CT which showed innumerable pulmonary nodules with recommendation for f/u CT 6 months, coarctation s/p surgical changes.  She returns for follow-up overall doing well without chest pain, palpitations, or syncope. She does note she gets tired quicker with exertion in the form of some mild dyspnea. She had hip replacement earlier this year and did not bounce right back into normal activity so she's unsure if perhaps it is related to deconditioning. No orthopnea, PND, LEE, hypoxia or tachycardia.   Past Medical History:  Diagnosis Date   Allergy    Anemia    Bilateral primary osteoarthritis of hip    Bilateral wrist pain    Blood transfusion without reported diagnosis    Cardiac device in situ 07/06/2018   Biotronik BIVICD , right chest   CHB (complete heart block) (HCC)    History of    Chronic combined systolic and diastolic CHF (congestive heart failure) (HCC)    Complication of anesthesia    hard to wake up at times in past   H/O prosthetic aortic valve replacement    Heart murmur    History of bronchitis    History of cardiac  arrest 01/2017   History of repair of coarctation of aorta    History of sustained ventricular tachycardia    History of ventricular fibrillation 01/2017   Hyperlipidemia    Hypertension    Non-ischemic cardiomyopathy (HCC)    Pneumonia    PONV (postoperative nausea and vomiting)    Presence of permanent cardiac pacemaker    right chest   Pulmonary nodules    Turner syndrome    Wears  hearing aid in both ears     Past Surgical History:  Procedure Laterality Date   CARDIAC ASSIST DEVICE REMOVAL     device got infected   DENTAL SURGERY     EYE SURGERY     Left eye lazy eye, childhood   PACEMAKER INSERTION     located on right side   TONSILLECTOMY     TOTAL HIP ARTHROPLASTY Left 11/27/2018   Procedure: TOTAL HIP ARTHROPLASTY ANTERIOR APPROACH;  Surgeon: Ollen Gross, MD;  Location: [facility];  Service: Orthopedics;  Laterality: Left;    TOTAL HIP ARTHROPLASTY Right 05/02/2019   Procedure: TOTAL HIP ARTHROPLASTY ANTERIOR APPROACH;  Surgeon: Ollen Gross, MD;  Location: [facility];  Service: Orthopedics;  Laterality: Right;     Current Medications: Current Meds  Medication Sig   acetaminophen (TYLENOL) 650 MG CR tablet Take 650 mg by mouth daily.   ferrous sulfate 325 (65 FE) MG tablet Take 325 mg by mouth daily with breakfast.   furosemide (LASIX) 20 MG tablet TAKE 1 TABLET BY MOUTH EVERY DAY   losartan (COZAAR) 25 MG tablet Take 1 tablet (25 mg total) by mouth daily.   metoprolol succinate (TOPROL-XL) 25 MG 24 hr tablet TAKE 1 TABLET BY MOUTH EVERY DAY   Multiple Vitamin (MULTI-VITAMIN) tablet Take 1 tablet by mouth daily.   Potassium 99 MG TABS Take 99 mg by mouth daily.     Allergies:   Lisinopril   Social History   Socioeconomic History   Marital status: Married    Spouse name: Not on file   Number of children: Not on file   Years of education: Not on file   Highest education level: Not on file  Occupational History   Not on file  Social Needs   Financial resource strain: Not on file   Food insecurity    Worry: Not on file    Inability: Not on file   Transportation needs    Medical: Not on file    Non-medical: Not on file  Tobacco Use   Smoking status: Never Smoker   Smokeless tobacco: Never Used  Substance and Sexual Activity   Alcohol use: Not Currently   Drug use: Never   Sexual activity: Yes     Partners: Male    Birth control/protection: Post-menopausal  Lifestyle   Physical activity    Days per week: Not on file    Minutes per session: Not on file   Stress: Not on file  Relationships   Social connections    Talks on phone: Not on file    Gets together: Not on file    Attends religious service: Not on file    Active member of club or organization: Not on file    Attends meetings of clubs or organizations: Not on file    Relationship status: Not on file  Other Topics Concern   Not on file  Social History Narrative   02/2018:   Married   3 adopted children, ages 107-9yo  Family History:  The patient's family history includes Atrial fibrillation in her father; Breast cancer in her mother; Cataracts in her mother; Kidney disease in her sister; Stroke in her father.  ROS:   Please see the history of present illness All other systems are reviewed and otherwise negative.    EKGs/Labs/Other Studies Reviewed:    Studies reviewed were summarized above.   EKG:  EKG is ordered today, personally reviewed, demonstrating atrial sensed, v paced rhythm 92bpm (biventricular pacemaker)  Recent Labs: 04/27/2019: ALT 18 05/04/2019: BUN 14; Creatinine, Ser 0.53; Hemoglobin 7.7; Platelets 169; Potassium 3.4; Sodium 136  Recent Lipid Panel    Component Value Date/Time   CHOL 238 (H) 02/27/2018 1153   TRIG 135 02/27/2018 1153   HDL 80 02/27/2018 1153   CHOLHDL 3.0 02/27/2018 1153   LDLCALC 131 (H) 02/27/2018 1153    PHYSICAL EXAM:    VS:  BP 110/60    Pulse 92    Ht 5' (1.524 m)    Wt 102 lb (46.3 kg)    LMP  (LMP Unknown)    SpO2 99%    BMI 19.92 kg/m   BMI: Body mass index is 19.92 kg/m.  GEN: Well nourished, well developed WF, in no acute distress HEENT: normocephalic, atraumatic Neck: no JVD, carotid bruits, or masses Cardiac: RRR; 3/6 SEM heard diffusely across precordium, no edema  Respiratory:  clear to auscultation bilaterally, normal work of breathing GI:  soft, nontender, nondistended, + BS MS: no deformity or atrophy Skin: warm and dry, no rash Neuro:  Alert and Oriented x 3, Strength and sensation are intact, follows commands Psych: euthymic mood, full affect  Wt Readings from Last 3 Encounters:  08/30/19 102 lb (46.3 kg)  05/02/19 102 lb (46.3 kg)  04/27/19 112 lb (50.8 kg)     ASSESSMENT & PLAN:   1. Chronic combined CHF with recent exertional dyspnea - unclear if she is manifesting symptoms of HF or simply deconditioning. She is NYHA class II. Will check basic labs and arrange f/u 2D echo (per her request, would like in our office). If LVEF is still down would consider transition from losartan to Entresto (and moving Lasix to every other day given diuretic effect of Entresto and tendency towards softer BP). Spironolactone could be considered in the future although her blood pressure traditionally runs on the softer side so would make one medicine change at a time. Clinically she looks euvolemic but does have significant murmur on exam. She is not tachycardic, hypoxic or tachypneic and has not had any chest pain. 2. Coarctation of the aorta - s/p repair in [place]. When I get the echocardiogram report, I will plan to discuss what further monitoring she needs for this with MD. She is not followed by any adult congenital specialists at this time. Of note, she also had pulmonary nodules on a CT in 2018. It is not clear to me from the chart that she has had repeat scanning as recommended for the nodules. I have asked her to discuss with her primary care provider about this, in which case a CT might suffice to assess her coarctation repair. 3. History of AVR - SBE ppx reviewed. Plan echocardiogram as above. 4. History of VT/VF - she clarified this was in 2012 only. Given hypokalemia earlier this year will check BMET/Mg.  Disposition: F/u with Dr. Delton See in 4 weeks virtually for follow-up to reassess trajectory of dyspnea. (Pendng admin review of  the out-of-network issue - was routed to them  on 9/21).  Medication Adjustments/Labs and Tests Ordered: Current medicines are reviewed at length with the patient today.  Concerns regarding medicines are outlined above. Medication changes, Labs and Tests ordered today are summarized above and listed in the Patient Instructions accessible in Encounters.   Signed, Laurann Montanaayna N Janyth Riera, PA-C  08/30/2019 8:48 AM    [facility] [address], [place], [place]  [address] Phone: [phone]) [phone]; Fax: [phone]

## 2019-08-27 NOTE — Telephone Encounter (Signed)
I will be happy to see her, however I do not understand how within 1 group 2 physician would fall into two different networks.  Can you please explain?

## 2019-08-27 NOTE — Telephone Encounter (Signed)
New Message   Patient is calling in stating that her insurance has changed from [place] to [facility] and that Dr. Oval Linsey is no longer included in her in network providers. Patient is requesting to switch to Dr. Meda Coffee due to the her being included in the coverage. Appointment has been scheduled with PA Dayna Dunn on 08/30/19 at 8:15 am and patient is requesting a echocardiogram as well, but there are no order in the system for it. Please advise on these things.

## 2019-08-30 ENCOUNTER — Telehealth: Payer: Self-pay | Admitting: *Deleted

## 2019-08-30 ENCOUNTER — Other Ambulatory Visit: Payer: Self-pay

## 2019-08-30 ENCOUNTER — Encounter: Payer: Self-pay | Admitting: Physician Assistant

## 2019-08-30 ENCOUNTER — Ambulatory Visit (INDEPENDENT_AMBULATORY_CARE_PROVIDER_SITE_OTHER): Payer: BC Managed Care – PPO | Admitting: Physician Assistant

## 2019-08-30 ENCOUNTER — Encounter: Payer: Self-pay | Admitting: *Deleted

## 2019-08-30 VITALS — BP 110/60 | HR 92 | Ht 60.0 in | Wt 102.0 lb

## 2019-08-30 DIAGNOSIS — I5042 Chronic combined systolic (congestive) and diastolic (congestive) heart failure: Secondary | ICD-10-CM | POA: Diagnosis not present

## 2019-08-30 DIAGNOSIS — R0609 Other forms of dyspnea: Secondary | ICD-10-CM

## 2019-08-30 DIAGNOSIS — Q251 Coarctation of aorta: Secondary | ICD-10-CM

## 2019-08-30 DIAGNOSIS — R918 Other nonspecific abnormal finding of lung field: Secondary | ICD-10-CM

## 2019-08-30 DIAGNOSIS — Z8679 Personal history of other diseases of the circulatory system: Secondary | ICD-10-CM | POA: Diagnosis not present

## 2019-08-30 DIAGNOSIS — Z952 Presence of prosthetic heart valve: Secondary | ICD-10-CM

## 2019-08-30 LAB — PRO B NATRIURETIC PEPTIDE: NT-Pro BNP: 553 pg/mL — ABNORMAL HIGH (ref 0–287)

## 2019-08-30 LAB — CBC
Hematocrit: 32.9 % — ABNORMAL LOW (ref 34.0–46.6)
Hemoglobin: 10.9 g/dL — ABNORMAL LOW (ref 11.1–15.9)
MCH: 27.6 pg (ref 26.6–33.0)
MCHC: 33.1 g/dL (ref 31.5–35.7)
MCV: 83 fL (ref 79–97)
Platelets: 310 10*3/uL (ref 150–450)
RBC: 3.95 x10E6/uL (ref 3.77–5.28)
RDW: 12.9 % (ref 11.7–15.4)
WBC: 7.1 10*3/uL (ref 3.4–10.8)

## 2019-08-30 LAB — BASIC METABOLIC PANEL
BUN/Creatinine Ratio: 24 — ABNORMAL HIGH (ref 9–23)
BUN: 16 mg/dL (ref 6–24)
CO2: 22 mmol/L (ref 20–29)
Calcium: 9.3 mg/dL (ref 8.7–10.2)
Chloride: 103 mmol/L (ref 96–106)
Creatinine, Ser: 0.66 mg/dL (ref 0.57–1.00)
GFR calc Af Amer: 115 mL/min/{1.73_m2} (ref >59)
GFR calc non Af Amer: 100 mL/min/{1.73_m2} (ref >59)
Glucose: 95 mg/dL (ref 65–99)
Potassium: 3.7 mmol/L (ref 3.5–5.2)
Sodium: 140 mmol/L (ref 134–144)

## 2019-08-30 LAB — MAGNESIUM: Magnesium: 2 mg/dL (ref 1.6–2.3)

## 2019-08-30 NOTE — Telephone Encounter (Signed)
Message has been sent to billing to follow up

## 2019-08-30 NOTE — Telephone Encounter (Signed)
I do not know.  We can send to our billing department and ask.

## 2019-08-30 NOTE — Telephone Encounter (Signed)
Virtual Visit Pre-Appointment Phone Call  "(Name), I am calling you today to discuss your upcoming appointment. We are currently trying to limit exposure to the virus that causes COVID-19 by seeing patients at home rather than in the office."  1. "What is the BEST phone number to call the day of the visit?" - include this in appointment notes-YES   2. Do you have or have access to (through a family member/friend) a smartphone with video capability that we can use for your visit?" a. If yes - list this number in appt notes as cell (if different from BEST phone #) and list the appointment type as a VIDEO visit in appointment notes-YES UPDATED   3. Confirm consent - "In the setting of the current Covid19 crisis, you are scheduled for a ( video) visit with DR. NELSON ON 10/10/19 AT 0940.  Just as we do with many in-office visits, in order for you to participate in this visit, we must obtain consent.  If you'd like, I can send this to your mychart (if signed up) or email for you to review.  Otherwise, I can obtain your verbal consent now.  All virtual visits are billed to your insurance company just like a normal visit would be.  By agreeing to a virtual visit, we'd like you to understand that the technology does not allow for your provider to perform an examination, and thus may limit your provider's ability to fully assess your condition. If your provider identifies any concerns that need to be evaluated in person, we will make arrangements to do so.  Finally, though the technology is pretty good, we cannot assure that it will always work on either your or our end, and in the setting of a video visit, we may have to convert it to a phone-only visit.  In either situation, we cannot ensure that we have a secure connection.  Are you willing to proceed?" STAFF: Did the patient verbally acknowledge consent to telehealth visit? Document YES/NO here: YES PT GAVE VERBAL CONSENT FOR DR Meda Coffee TO TREAT HER VIA  VIDEO VISIT ON 10/10/19 AND ALSO SENT IN [place] FOR HER REVIEW.  4. Advise patient to be prepared - "Two hours prior to your appointment, go ahead and check your blood pressure, pulse, oxygen saturation, and your weight (if you have the equipment to check those) and write them all down. When your visit starts, your provider will ask you for this information. If you have an Apple Watch or Kardia device, please plan to have heart rate information ready on the day of your appointment. Please have a pen and paper handy nearby the day of the visit as well."-YES AWARE  5. Give patient instructions download to smartphone Doxy.me as below if video visit (depending on what platform provider is using)-YES MADE AWARE  6. Inform patient they will receive a phone call 15 minutes prior to their appointment time (may be from unknown caller ID) so they should be prepared to [place] has been deemed a candidate for a follow-up tele-health visit to limit community exposure during the Covid-19 pandemic. I spoke with the patient via phone to ensure availability of phone/video source, confirm preferred email & phone number, and discuss instructions and expectations.  I reminded Michelle Lloyd to be prepared with any vital sign and/or heart rhythm information that could potentially be obtained via home monitoring, at the time of her visit. I  reminded Michelle Lloyd to expect a phone call prior to her visit.  Michelle Socks, LPN 7/62/8315 17:61 AM     IF USING [email or web address].ME - The patient will receive a link just prior to their visit by text.-YES GIVEN     FULL LENGTH CONSENT FOR TELE-HEALTH VISIT   I hereby voluntarily request, consent and authorize [facility] and its employed or contracted physicians, physician assistants, nurse practitioners or other licensed health care professionals (the Practitioner), to provide me with telemedicine health  care services (the Services") as deemed necessary by the treating Practitioner. I acknowledge and consent to receive the Services by the Practitioner via telemedicine. I understand that the telemedicine visit will involve communicating with the Practitioner through live audiovisual communication technology and the disclosure of certain medical information by electronic transmission. I acknowledge that I have been given the opportunity to request an in-person assessment or other available alternative prior to the telemedicine visit and am voluntarily participating in the telemedicine visit.  I understand that I have the right to withhold or withdraw my consent to the use of telemedicine in the course of my care at any time, without affecting my right to future care or treatment, and that the Practitioner or I may terminate the telemedicine visit at any time. I understand that I have the right to inspect all information obtained and/or recorded in the course of the telemedicine visit and may receive copies of available information for a reasonable fee.  I understand that some of the potential risks of receiving the Services via telemedicine include:   Delay or interruption in medical evaluation due to technological equipment failure or disruption;  Information transmitted may not be sufficient (e.g. poor resolution of images) to allow for appropriate medical decision making by the Practitioner; and/or   In rare instances, security protocols could fail, causing a breach of personal health information.  Furthermore, I acknowledge that it is my responsibility to provide information about my medical history, conditions and care that is complete and accurate to the best of my ability. I acknowledge that Practitioner's advice, recommendations, and/or decision may be based on factors not within their control, such as incomplete or inaccurate data provided by me or distortions of diagnostic images or specimens that  may result from electronic transmissions. I understand that the practice of medicine is not an exact science and that Practitioner makes no warranties or guarantees regarding treatment outcomes. I acknowledge that I will receive a copy of this consent concurrently upon execution via email to the email address I last provided but may also request a printed copy by calling the office of [facility].    I understand that my insurance will be billed for this visit.   I have read or had this consent read to me.  I understand the contents of this consent, which adequately explains the benefits and risks of the Services being provided via telemedicine.   I have been provided ample opportunity to ask questions regarding this consent and the Services and have had my questions answered to my satisfaction.  I give my informed consent for the services to be provided through the use of telemedicine in my medical care  By participating in this telemedicine visit I agree to the above. PT GAVE VERBAL CONSENT FOR DR Delton See TO TREAT HER VIA VIDEO VISIT ON 11/4 AS WELL AS CONSENT SENT TO MYCHART TO REVIEW.

## 2019-08-30 NOTE — Telephone Encounter (Signed)
Agree.  I'm fine with her seeing whomever she would like.  However, this doesn't make sense.  Do we know why this would be?

## 2019-08-30 NOTE — Patient Instructions (Addendum)
Medication Instructions:  Your physician recommends that you continue on your current medications as directed. Please refer to the Current Medication list given to you today.  If you need a refill on your cardiac medications before your next appointment, please call your pharmacy.   Lab work: TODAY:  BMET, CBC, MAG, & PRO BNP  If you have labs (blood work) drawn today and your tests are completely normal, you will receive your results only by: Marland Kitchen MyChart Message (if you have MyChart) OR . A paper copy in the mail If you have any lab test that is abnormal or we need to change your treatment, we will call you to review the results.  Testing/Procedures: Your physician has requested that you have an echocardiogram 08/31/2019 ARRIVE AT 1:35 FOR REGISTRATION. Echocardiography is a painless test that uses sound waves to create images of your heart. It provides your doctor with information about the size and shape of your heart and how well your heart's chambers and valves are working. This procedure takes approximately one hour. There are no restrictions for this procedure.   Follow-Up: At [facility], you and your health needs are our priority.  As part of our continuing mission to provide you with exceptional heart care, we have created designated Provider Care Teams.  These Care Teams include your primary Cardiologist (physician) and Advanced Practice Providers (APPs -  Physician Assistants and Nurse Practitioners) who all work together to provide you with the care you need, when you need it. 4 WEEKS WITH DR. Delton See.  (VIRTUAL APPT IS FINE)   Any Other Special Instructions Will Be Listed Below (If Applicable).  Endocarditis Information  You may be at risk for developing endocarditis since you have  an artificial heart valve  or a repaired heart valve. Endocarditis is an infection of the lining of the heart or heart valves.   Certain surgical and dental procedures may put you at risk,  such as  teeth cleaning or other dental procedures or any surgery involving the respiratory, urinary, gastrointestinal tract, gallbladder or prostate.   Notify your doctor or dentist before having any invasive procedures. You will need to take antibiotics before certain procedures.   To prevent endocarditis, maintain good oral health. Seek prompt medical attention for any mouth/gum, skin or urinary tract infections.  Please follow up with your primary care provider to discuss follow-up plan for the pulmonary nodules seen on your CT scan from 10/2017.Marland Kitchen   Echocardiogram An echocardiogram is a procedure that uses painless sound waves (ultrasound) to produce an image of the heart. Images from an echocardiogram can provide important information about:  Signs of coronary artery disease (CAD).  Aneurysm detection. An aneurysm is a weak or damaged part of an artery wall that bulges out from the normal force of blood pumping through the body.  Heart size and shape. Changes in the size or shape of the heart can be associated with certain conditions, including heart failure, aneurysm, and CAD.  Heart muscle function.  Heart valve function.  Signs of a past heart attack.  Fluid buildup around the heart.  Thickening of the heart muscle.  A tumor or infectious growth around the heart valves. Tell a health care provider about:  Any allergies you have.  All medicines you are taking, including vitamins, herbs, eye drops, creams, and over-the-counter medicines.  Any blood disorders you have.  Any surgeries you have had.  Any medical conditions you have.  Whether you are pregnant or may be pregnant.  What are the risks? Generally, this is a safe procedure. However, problems may occur, including:  Allergic reaction to dye (contrast) that may be used during the procedure. What happens before the procedure? No specific preparation is needed. You may eat and drink normally. What happens during the  procedure?   An IV tube may be inserted into one of your veins.  You may receive contrast through this tube. A contrast is an injection that improves the quality of the pictures from your heart.  A gel will be applied to your chest.  A wand-like tool (transducer) will be moved over your chest. The gel will help to transmit the sound waves from the transducer.  The sound waves will harmlessly bounce off of your heart to allow the heart images to be captured in real-time motion. The images will be recorded on a computer. The procedure may vary among health care providers and hospitals. What happens after the procedure?  You may return to your normal, everyday life, including diet, activities, and medicines, unless your health care provider tells you not to do that. Summary  An echocardiogram is a procedure that uses painless sound waves (ultrasound) to produce an image of the heart.  Images from an echocardiogram can provide important information about the size and shape of your heart, heart muscle function, heart valve function, and fluid buildup around your heart.  You do not need to do anything to prepare before this procedure. You may eat and drink normally.  After the echocardiogram is completed, you may return to your normal, everyday life, unless your health care provider tells you not to do that. This information is not intended to replace advice given to you by your health care provider. Make sure you discuss any questions you have with your health care provider. Document Released: 11/19/2000 Document Revised: 03/15/2019 Document Reviewed: 12/25/2016 Elsevier Patient Education  2020 [employer].

## 2019-08-31 ENCOUNTER — Telehealth: Payer: Self-pay | Admitting: Physician Assistant

## 2019-08-31 ENCOUNTER — Ambulatory Visit (HOSPITAL_COMMUNITY): Payer: BC Managed Care – PPO | Attending: Cardiology

## 2019-08-31 DIAGNOSIS — I5042 Chronic combined systolic (congestive) and diastolic (congestive) heart failure: Secondary | ICD-10-CM | POA: Insufficient documentation

## 2019-08-31 MED ORDER — FUROSEMIDE 20 MG PO TABS: ORAL_TABLET | ORAL | 3 refills | Status: DC

## 2019-08-31 MED ORDER — POTASSIUM CHLORIDE CRYS ER 20 MEQ PO TBCR: EXTENDED_RELEASE_TABLET | ORAL | 3 refills | Status: DC

## 2019-08-31 NOTE — Telephone Encounter (Signed)
Follow Up:     Returning your call, concerning  Her lab results.

## 2019-08-31 NOTE — Telephone Encounter (Signed)
Returned pts call and she has been made aware of her lab results. See result note. 

## 2019-09-04 ENCOUNTER — Telehealth: Payer: Self-pay | Admitting: Cardiology

## 2019-09-04 NOTE — Telephone Encounter (Signed)
New Message  Patient is calling in for the echo results. Please give patient a call back.  

## 2019-09-04 NOTE — Telephone Encounter (Signed)
Looks like patient is wanting to switch to Dr.Nelson-  But ECHO from Melina Copa, [place] requested that Dr.[place] take a look at [facility], Will route to get results.

## 2019-09-06 ENCOUNTER — Telehealth: Payer: Self-pay

## 2019-09-06 MED ORDER — ENTRESTO 24-26 MG PO TABS: 1.0000 | ORAL_TABLET | Freq: Two times a day (BID) | ORAL | 3 refills | Status: DC

## 2019-09-06 MED ORDER — FUROSEMIDE 20 MG PO TABS: ORAL_TABLET | ORAL | 3 refills | Status: DC

## 2019-09-06 NOTE — Telephone Encounter (Signed)
Notes recorded by Frederik Schmidt, RN on 09/06/2019 at 1:24 PM EDT  The patient has been notified of the Echo result and verbalized understanding. All questions (if any) were answered.  Frederik Schmidt, RN 09/06/2019 1:23 PM

## 2019-09-06 NOTE — Telephone Encounter (Signed)
-----   Message from Charlie Pitter, [place] sent at 09/06/2019 12:52 PM EDT ----- Routing to triage since Anderson Malta is off. I am as well but wanted to result this echo. Pt was anxious for result so please relay note below from Dr Oval Linsey who reviewed echo and felt it was stable from prior [facility] study. She agrees with what I had preliminarily discussed with Ms Setzler at her most recent OV - let's change losartan to [facility] with f/u outlined by Dr Oval Linsey below with lab and appt. Please check with admin as to whether there has been any update about why her insurance said Dr Oval Linsey was out of network (this is what prompted her to get put on Nelson's schedule going forward - there's a phone note about this.) if she is able to stay with Dr Oval Linsey I would recommend this given her complex history and the fact that Dr Oval Linsey is familiar with her. But if insurance remains an issue, ok to f/u Dr Meda Coffee.  I also reviewed diuretic with Dr Oval Linsey - since [place] itself can have a little diuretic effect, she should also change Lasix to every other day. Keep potassium chloride supplement at once daily, but stop the OTC potassium supplement to simplify her regimen (the 99mg  OTC supplement is only equal to 2-3 mEq anyway). Thanks [employer]

## 2019-09-07 ENCOUNTER — Telehealth: Payer: Self-pay | Admitting: Physician Assistant

## 2019-09-07 ENCOUNTER — Telehealth: Payer: Self-pay

## 2019-09-07 DIAGNOSIS — Z79899 Other long term (current) drug therapy: Secondary | ICD-10-CM

## 2019-09-07 MED ORDER — ENTRESTO 24-26 MG PO TABS: 1.0000 | ORAL_TABLET | Freq: Two times a day (BID) | ORAL | 3 refills | Status: DC

## 2019-09-07 MED ORDER — LOSARTAN POTASSIUM 25 MG PO TABS: 25.0000 mg | ORAL_TABLET | Freq: Every day | ORAL | 3 refills | Status: DC

## 2019-09-07 NOTE — Telephone Encounter (Addendum)
**Note De-Identified Michelle Lloyd Obfuscation** I called [facility] and s/w Michelle Lloyd who advised me that a 90 day supply of Michelle Lloyd will cost the pt. $1059 and that her insurance is paying some of it (No PA required). Looks like the pt has a high deductible.  I called the pt who confirmed that she does have a high deductible that has not been met this year and that this policy is through a new Ins provider to her and her husband so she does not have a good understanding of the coverage. She wants to discuss the details with her husband as she is aware that the cost for Entresto will decrease considerably once her deductible is met.  She is requesting to continue taking Lasartan 25 mg daily until her f/u with Dr Meda Coffee on  10/10/2019.  I have added Losartan 25 mg back to the pts med list and left Entresto with note stating it is on hold until appt on 11/04.  Will forward note to Dr Meda Coffee and her nurse as Juluis Rainier.

## 2019-09-07 NOTE — Telephone Encounter (Signed)
I spoke with the patient and scheduled BMET for 10/12 (1 week after starting Entresto).  She verbalized understanding.

## 2019-09-07 NOTE — Telephone Encounter (Signed)
Please confer with our assistance liaison to see if they can help.  She does not have to be off losartan for 3 days prior to [name] - not sure where this information came from. This is incorrect and I have conferred with our pharmacy team to verify. (If a patient is on an ACEI inhibitor they have to have a 36 hour waiting period but losartan is not an ACEI inhibitor.)  She should revert to her original regimen of losartan 25mg  once daily and Lasix 20mg  once daily while she is waiting to fill the Entresto. When / if able to get Michelle Lloyd, will need to follow original plan as outlined in yesterday's phone note where she changes Lasix to every other day and has BMET in 1 week.  Dayna Dunn PA-C

## 2019-09-07 NOTE — Telephone Encounter (Signed)
Patient calling the office for samples of medication:   1.  What medication and dosage are you requesting samples for? sacubitril-valsartan (ENTRESTO) 24-26 MG  2.  Are you currently out of this medication? yes  Patient would like to know if we had any samples or a program that she could be enrolled in to assist her with the cost of the medication. She called her pharmacy to check the price, and her pharmacy told her it would be over $1000 to fill that rx. She can not afford that medication.   She was on losartan in the past, and wonders if she could just go back on that to save money.   Any help would be appreciated  Patient also wanted to let the office know that she has NOT taken her Losartan yet. She was told to be off of the losartan for three days prior to starting the [facility].

## 2019-09-07 NOTE — Telephone Encounter (Signed)
-----   Message from Dayna N Dunn, PA-C sent at 09/07/2019  7:45 AM EDT ----- Hi Janesa Dockery, I do not see the lab appt or lab order in Epic, can you make sure this gets done? Thanks 

## 2019-09-07 NOTE — Telephone Encounter (Signed)
Spoke with patient regarding Dayna Dunn's recommendation, pt verbalized understanding. I let pt know that we would try to get our liaison to help her out with getting the Entresto. Sent over to [employer]. Pt is aware to continue her previous regimen until further notice.

## 2019-09-07 NOTE — Telephone Encounter (Signed)
-----   Message from Charlie Pitter, [place] sent at 09/07/2019  7:45 AM EDT ----- Marcelline Mates, I do not see the lab appt or lab order in Epic, can you make sure this gets done? Thanks

## 2019-09-07 NOTE — Addendum Note (Signed)
Addended by: Dennie Fetters on: 09/07/2019 12:33 PM   Modules accepted: Orders

## 2019-09-17 ENCOUNTER — Other Ambulatory Visit: Payer: BC Managed Care – PPO

## 2019-10-04 ENCOUNTER — Telehealth: Payer: Self-pay | Admitting: Cardiology

## 2019-10-04 NOTE — Telephone Encounter (Signed)
New Message ° °Patient is returning call. Please give patient a call back.  °

## 2019-10-04 NOTE — Telephone Encounter (Signed)
Call was to review pts medication list for tomorrows virtual visit. This has already been addressed.

## 2019-10-05 ENCOUNTER — Telehealth (INDEPENDENT_AMBULATORY_CARE_PROVIDER_SITE_OTHER): Payer: BC Managed Care – PPO | Admitting: Cardiology

## 2019-10-05 ENCOUNTER — Encounter: Payer: Self-pay | Admitting: *Deleted

## 2019-10-05 ENCOUNTER — Other Ambulatory Visit: Payer: Self-pay

## 2019-10-05 ENCOUNTER — Telehealth: Payer: Self-pay | Admitting: *Deleted

## 2019-10-05 ENCOUNTER — Encounter: Payer: Self-pay | Admitting: Cardiology

## 2019-10-05 DIAGNOSIS — Z95810 Presence of automatic (implantable) cardiac defibrillator: Secondary | ICD-10-CM | POA: Diagnosis not present

## 2019-10-05 DIAGNOSIS — I359 Nonrheumatic aortic valve disorder, unspecified: Secondary | ICD-10-CM

## 2019-10-05 DIAGNOSIS — I428 Other cardiomyopathies: Secondary | ICD-10-CM

## 2019-10-05 DIAGNOSIS — Z8774 Personal history of (corrected) congenital malformations of heart and circulatory system: Secondary | ICD-10-CM

## 2019-10-05 DIAGNOSIS — I469 Cardiac arrest, cause unspecified: Secondary | ICD-10-CM

## 2019-10-05 MED ORDER — FUROSEMIDE 20 MG PO TABS: ORAL_TABLET | ORAL | 4 refills | Status: DC

## 2019-10-05 MED ORDER — SPIRONOLACTONE 25 MG PO TABS: 12.5000 mg | ORAL_TABLET | Freq: Every day | ORAL | 2 refills | Status: DC

## 2019-10-05 MED ORDER — ENTRESTO 24-26 MG PO TABS: 1.0000 | ORAL_TABLET | Freq: Two times a day (BID) | ORAL | 3 refills | Status: DC

## 2019-10-05 NOTE — Patient Instructions (Signed)
Medication Instructions:   WE WILL BE CALLING YOU IN THE VERY NEAR FUTURE TO ENDORSE YOUR NEW MEDICATION REGIMEN, BASED ON IF ENTRESTO IS APPROVED BY YOUR INSURANCE OR NOT.  WE HAVE SENT A MESSAGE TO OUR PHARMACIST AND PRIOR AUTHORIZATION NURSE TO FURTHER ASSIST WITH THIS, AND ONCE THEY GET BACK TO DR. Meda Coffee AND MYSELF, WE WILL CALL YOU SHORTLY THEREAFTER TO ENDORSE YOUR NEW MEDICATION REGIMEN, AND ANY FOLLOW-UP LABS NEEDED.   *If you need a refill on your cardiac medications before your next appointment, please call your pharmacy*  Lab Work:  WILL BE BASED ON ENTRESTO START AND COVERAGE.  TO BE DETERMINED.   If you have labs (blood work) drawn today and your tests are completely normal, you will receive your results only by: Marland Kitchen MyChart Message (if you have MyChart) OR . A paper copy in the mail If you have any lab test that is abnormal or we need to change your treatment, we will call you to review the results.    Follow-Up: At [facility], you and your health needs are our priority.  As part of our continuing mission to provide you with exceptional heart care, we have created designated Provider Care Teams.  These Care Teams include your primary Cardiologist (physician) and Advanced Practice Providers (APPs -  Physician Assistants and Nurse Practitioners) who all work together to provide you with the care you need, when you need it.  Your next appointment:   January 28, 2020 AT 10:20 AM-WITH AN EKG  The format for your next appointment:   [place]  Provider:   Ena Dawley, MD  Other Instructions WE WILL CALL YOU SOON TO DISCUSS YOUR MEDICATION THERAPY BASED ON ENTRESTO COVERAGE OR NOT.

## 2019-10-05 NOTE — Telephone Encounter (Signed)
-----   Message from Dorothy Spark, MD sent at 10/05/2019 10:48 AM EDT ----- She needs preferably in person visit in 3 months, she was started on Entresto but she could not afford it they asked her for over thousand dollars co-pay, Jinny Blossom would you be able to look into her insurance and see if there is anything we can do like a co-pay card, IV if she is being started on Entresto then she will need BMP and BNP in 1 month, and switch Lasix 20 mg to every other day only. If she cannot be started on Entresto I would start her on low-dose spironolactone 12.5 daily and decrease dose of Lasix to 20 mg every other day.  I would also stop her potassium supplement if she is being started on spironolactone.  Thank you,  K ----- Message ----- From: [name], LPN Sent: [id number]  10:03 AM EDT To: Dorothy Spark, MD  Pt is ready for her video visit Text her at [phone]

## 2019-10-05 NOTE — Telephone Encounter (Signed)
I have reached out to the pt concerning the cost of her Entresto. Please see Medication problem phone note from 09/07/2019 for details.

## 2019-10-05 NOTE — Telephone Encounter (Signed)
Spoke with the pt and endorsed to her recommendations per Dr Meda Coffee.  Pt is aware we are still awaiting further recommendations from our [place] and Prior [place] [employer], for approval of Entresto or not.  Informed the pt that we will follow-up with her accordingly thereafter.  Pt verbalized understanding and agrees with this plan.

## 2019-10-05 NOTE — Telephone Encounter (Signed)
Can you ask pharmacists if there is a discount card she can use? Thank you

## 2019-10-05 NOTE — Telephone Encounter (Signed)
Pt called back to say that she was informed by her Husband that they will be switching over to a new insurance plan in Jan 2021, and the deductible will be much less, so she wants to hold off of Entresto for now, revisit this med in Jan 2021 when new insurance kicks in, and start Dr. Francesca Oman other plan for her to switch to spironolactone 12.5 mg po daily.  Endorsed to the pt that being we will take Entresto off her med list and send in for spironolactone 12.5 mg po daily, Dr Meda Coffee therefore now wants her to take decreased dose of lasix 20 mg po EOD, STOP KDUR, and CONTINUE her current dose of Losartan 25 mg po daily. Informed the pt that Dr Meda Coffee agreed to see how her insurance will cover Entresto in Jan 2021, and go with the spironolactone 12.5 po daily.  Confirmed the pharmacy of choice with the pt.  Pt states she has enough lasix 20 mg tablets on hand at this time, and will call for further refills.  Pt is aware that she is to STOP taking KDUR while on spironolactone.  She is also aware to continue taking Losartan at its current dose.  Will inform our Prior Auth Nurse of this plan.  Pt verbalized understanding and agrees with this plan.

## 2019-10-05 NOTE — Addendum Note (Signed)
Addended by: [name] on: 10/05/2019 02:10 PM   Modules accepted: Orders

## 2019-10-05 NOTE — Telephone Encounter (Signed)
**Note De-Identified Michelle Lloyd Obfuscation** I re-[facility] the p[facility] all the information from an [place] $10 a month co-pay card typed in notes to pharmacy.  I then called [facility] and was advised that they put in all he information I provided them but it did not work. The pharmacist states that the pt will have to activate the card.  I called the pt and made her aware that I have sent info from an [place] $10 co[facility]pay card [facility] and that she will need to activate the card to see if she is eligible to receive the benefit.   She is aware that we are leaving her a $10 co-pay Entresto card at our screening table in the downstairs lobby of Dr [employer] office.  She states that she will try to pick it up today and if not she will pick up on Monday 11/2 and will continue taking Losartan until this is handled.

## 2019-10-05 NOTE — Telephone Encounter (Addendum)
Dr. Meda Coffee, according to Prior Auth Nurse Jeani Hawking, the pt has not met her deductible, which is why the cost of Delene Loll is so expensive (refer to 10/2 phone note for further detail about this).   Per Jeani Hawking Via Prior Auth Nurse, she will try one more thing.   Via, [name], LPN  [name], LPN        Darcella Cheshire,  I sent you a message back in the phone note but I have since decided that Im gonna try a tier ex for her Entresto.  It may not work this time but I have had some luck getting tier ex since covid.  Im gonna try one now for this pt to see if that helps.  Thanks, [employer]

## 2019-10-05 NOTE — Progress Notes (Signed)
Virtual Visit via Video Note   This visit type was conducted due to national recommendations for restrictions regarding the COVID-19 Pandemic (e.g. social distancing) in an effort to limit this patient's exposure and mitigate transmission in our community.  Due to her co-morbid illnesses, this patient is at least at moderate risk for complications without adequate follow up.  This format is felt to be most appropriate for this patient at this time.  All issues noted in this document were discussed and addressed.  A limited physical exam was performed with this format.  Please refer to the patient's chart for her consent to telehealth for [facility].   Date:  10/05/2019   ID:  Michelle Lloyd, DOB 1955/08/21, MRN [id number]  Patient Location: Home Provider Location: Home  PCP:  Myles Lipps, MD  Cardiologist:  Chilton Si, MD --> Dr Delton See Electrophysiologist:  None   Evaluation Performed:  Follow-Up Visit  Chief Complaint:  DOE  History of Present Illness:    Michelle Lloyd is a 53 y.o. female with history of chronic systolic and diastolic heart failure, Turner syndrome, repair of coarctation of the aorta 1980, bicuspid aortic valve status post aortic valve replacement, ventricular tachycardia s/p ICD, hypertension, pulmonary nodules who presents for follow-up.  History is outlined in Dr. Leonides Sake note from 04/2019 and also derived from [facility]. Michelle Lloyd underwent repair of coarctation of the aorta in 1980 with a Gortex graft between descending aorta and left subclavian artery.She developed nonischemic cardiomyopathy. She had a bicuspid aortic valve and underwent bioprosthetic AVR in 2012. 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 She had sustained VT/VF arrest 02/2011 one week after AVR. It is mentioned this possibly occurred in 2018 but she refutes this. She had a biventricular ICD placed 02/2011. She  had an RV lead fracture and a left innominate vein occlusion in 2016.She underwent laser lead extraction and she subsequently underwent implantation of a right-sided Biotronik ICD 06/2015. Her LVEF improved to 50% despite downgrade to a single lead ICD. She had a device infection 09/2017.Her ICD was removed and replaced at [facility].She had a repeat echo 11/2017 that reportedly showed a reduction in her LVEF to 30%. She then had an echoat [facility] 05/2018 that revealed LVEF 30% with global hypokinesis with exception of normal wall motion in the posterior myocardium and akinesis of the septum. She underwent BiV upgrade 07/2018. Her device is on the right. At her post op device check the LV lead was not capturing so she had a lead revision 09/02/18.She was due to have a repeat echo at [facility] in 06/2019 but I do not see this occurred yet. Labs 04/2019 showed K 3.4, Cr 0.53, Hgb 7.7 (in setting of hip replacement), LFTs 02/2019 wnl, 02/2018 LDL 131 (followed by PCP). In 10/2017 she had a CT which showed innumerable pulmonary nodules with recommendation for f/u CT 6 months, coarctation s/p surgical changes.  08/30/2019 -she was seen by Lucile Crater and reported no chest pain, palpitations, or syncope. She does note she gets tired quicker with exertion in the form of some mild dyspnea. She had hip replacement earlier this year and did not bounce right back into normal activity so she's unsure if perhaps it is related to deconditioning. No orthopnea, PND, LEE, hypoxia or tachycardia.   10/05/2019 -today she states that she has been doing well, she still feels fatigued but she is able to walk every day 20 to 30 minutes a day,  with no significant shortness of breath and no chest pain.  She has no lower extremity edema no orthopnea proximal nocturnal dyspnea.  No palpitation dizziness falls or syncope.  She was prescribed Entresto however her co-pay was over $1000 and she stayed on losartan.  The patient does not have symptoms  concerning for COVID-19 infection (fever, chills, cough, or new shortness of breath).    Past Medical History:  Diagnosis Date   Allergy    Anemia    Bilateral primary osteoarthritis of hip    Bilateral wrist pain    Blood transfusion without reported diagnosis    Cardiac device in situ 07/06/2018   Biotronik BIVICD , right chest   CHB (complete heart block) (HCC)    History of    Chronic combined systolic and diastolic CHF (congestive heart failure) (HCC)    Complication of anesthesia    hard to wake up at times in past   H/O prosthetic aortic valve replacement    Heart murmur    History of bronchitis    History of cardiac arrest 01/2017   History of repair of coarctation of aorta    History of sustained ventricular tachycardia    History of ventricular fibrillation 01/2017   Hyperlipidemia    Hypertension    Non-ischemic cardiomyopathy (HCC)    Pneumonia    PONV (postoperative nausea and vomiting)    Presence of permanent cardiac pacemaker    right chest   Pulmonary nodules    Turner syndrome    Wears hearing aid in both ears    Past Surgical History:  Procedure Laterality Date   CARDIAC ASSIST DEVICE REMOVAL     device got infected   DENTAL SURGERY     EYE SURGERY     Left eye lazy eye, childhood   PACEMAKER INSERTION     located on right side   TONSILLECTOMY     TOTAL HIP ARTHROPLASTY Left 11/27/2018   Procedure: TOTAL HIP ARTHROPLASTY ANTERIOR APPROACH;  Surgeon: Gaynelle Arabian, MD;  Location: [facility];  Service: Orthopedics;  Laterality: Left;  141min   TOTAL HIP ARTHROPLASTY Right 05/02/2019   Procedure: TOTAL HIP ARTHROPLASTY ANTERIOR APPROACH;  Surgeon: Gaynelle Arabian, MD;  Location: [facility];  Service: Orthopedics;  Laterality: Right;  157min     Current Meds  Medication Sig   acetaminophen (TYLENOL) 650 MG CR tablet Take 650 mg by mouth daily.   ferrous sulfate 325 (65 FE) MG tablet Take 325 mg by mouth daily with  breakfast.   furosemide (LASIX) 20 MG tablet Take 1 tablet 20 mg every other day.   losartan (COZAAR) 25 MG tablet Take 1 tablet (25 mg total) by mouth daily.   Magnesium 400 MG CAPS Take 400 mg by mouth daily.   metoprolol succinate (TOPROL-XL) 25 MG 24 hr tablet TAKE 1 TABLET BY MOUTH EVERY DAY   Multiple Vitamin (MULTI-VITAMIN) tablet Take 1 tablet by mouth daily.   potassium chloride SA (K-DUR) 20 MEQ tablet Take 1 tablet by mouth twice a day X's 2 days then go down to 1 tablet by mouth daily   sacubitril-valsartan (ENTRESTO) 24-26 MG Take 1 tablet by mouth 2 (two) times daily.     Allergies:   Lisinopril   Social History   Tobacco Use   Smoking status: Never Smoker   Smokeless tobacco: Never Used  Substance Use Topics   Alcohol use: Not Currently   Drug use: Never     Family Hx: The patient's family history  includes Atrial fibrillation in her father; Breast cancer in her mother; Cataracts in her mother; Kidney disease in her sister; Stroke in her father.  ROS:   Please see the history of present illness.    All other systems reviewed and are negative.   Prior CV studies:   The following studies were reviewed today:  TTE: 08/31/2019  1. Left ventricular ejection fraction, by visual estimation, is 35 to 40%. The left ventricle has moderately decreased function. Normal left ventricular size. There is moderately increased left ventricular hypertrophy.  2. Elevated mean left atrial pressure.  3. Left ventricular diastolic Doppler parameters are consistent with impaired relaxation pattern of LV diastolic filling.  4. Global right ventricle has normal systolic function.The right ventricular size is normal.  5. Left atrial size was normal.  6. Right atrial size was normal.  7. Mild mitral annular calcification.  8. The mitral valve is grossly normal. Trace mitral valve regurgitation. No evidence of mitral stenosis.  9. The tricuspid valve is normal in structure.  Tricuspid valve regurgitation is trivial. 10. Aortic valve regurgitation was not visualized by color flow Doppler. 11. The pulmonic valve was not well visualized. Pulmonic valve regurgitation is trivial by color flow Doppler. 12. Normal pulmonary artery systolic pressure. 13. A pacer wire is visualized in the RV. 14. The inferior vena cava is normal in size with greater than 50% respiratory variability, suggesting right atrial pressure of 3 mmHg. 15. Global hypokinesis worse in the septum; overall moderate LV dysfunction; grade 1 diastolic dysfunction; moderate LVH; s/p AVR with elevated mean gradient (27 mmHg).  Labs/Other Tests and Data Reviewed:    EKG:  No ECG reviewed.  Recent Labs: 04/27/2019: ALT 18 08/30/2019: BUN 16; Creatinine, Ser 0.66; Hemoglobin 10.9; Magnesium 2.0; NT-Pro BNP 553; Platelets 310; Potassium 3.7; Sodium 140   Recent Lipid Panel Lab Results  Component Value Date/Time   CHOL 238 (H) 02/27/2018 11:53 AM   TRIG 135 02/27/2018 11:53 AM   HDL 80 02/27/2018 11:53 AM   CHOLHDL 3.0 02/27/2018 11:53 AM   LDLCALC 131 (H) 02/27/2018 11:53 AM    Wt Readings from Last 3 Encounters:  10/05/19 99 lb (44.9 kg)  08/30/19 102 lb (46.3 kg)  05/02/19 102 lb (46.3 kg)     Objective:    Vital Signs:  BP 113/64    Pulse 79    Wt 99 lb (44.9 kg)    LMP  (LMP Unknown)    BMI 19.33 kg/m    VITAL SIGNS:  reviewed    ASSESSMENT & PLAN:    1. Chronic combined CHF with recent exertional dyspnea -now improved, with walking every day, she is advised that it is okay to do stationary bike and alternate with walking or just do biking as long as she performs 20 to 30 minutes a day.  We will talk to our pharmacy and see if she qualifies for a discount card for Entresto at that would be preferable management with LVEF 35 to 40%.  If her Sherryll Burgerntresto is not approved I would add spironolactone 12.5 mg daily and switch Lasix to 20 mg every other day and discontinue her potassium  supplements. 2. Coarctation of the aorta - s/p repair in [place]. Of note, she also had pulmonary nodules on a CT in 2018. It is not clear to me from the chart that she has had repeat scanning as recommended for the nodules. I have asked her to discuss with her primary care provider about this, in which  case a CT might suffice to assess her coarctation repair. 3. History of AVR - SBE ppx reviewed.  No AI mean gradient of 27 mmHg, peak gradient 44 mmHg.  On the echo in September 2020.   4. History of VT/VF - she clarified this was in 2012 only. Given hypokalemia earlier this year, and magnesium normal on the last lab on 08/30/2019.  COVID-19 Education: The signs and symptoms of COVID-19 were discussed with the patient and how to seek care for testing (follow up with PCP or arrange E-visit).  The importance of social distancing was discussed today.  Time:   Today, I have spent 25 minutes with the patient with telehealth technology discussing the above problems.     Medication Adjustments/Labs and Tests Ordered: Current medicines are reviewed at length with the patient today.  Concerns regarding medicines are outlined above.   Tests Ordered: No orders of the defined types were placed in this encounter.  Medication Changes: No orders of the defined types were placed in this encounter.  Follow Up:  In Person in 3 month(s)  Signed, Tobias Alexander, MD  10/05/2019 10:17 AM    [facility]

## 2019-10-05 NOTE — Telephone Encounter (Signed)
° ° ° °  Patient calling, states she forgot to ask Dr Meda Coffee questions regarding timeframe for next valve replacement

## 2019-10-05 NOTE — Telephone Encounter (Signed)
Via, [name], LPN  [name], LPN        It also looks like the pt never got a $10 a month Entresto card so Im trying that now too:).  Michelle Lloyd

## 2019-10-05 NOTE — Telephone Encounter (Signed)
Please tell her that based on the last eco she doesn't need a valve replacement right now, we will follow echos periodically.

## 2019-10-10 ENCOUNTER — Telehealth: Payer: Self-pay | Admitting: Cardiology

## 2019-11-14 ENCOUNTER — Telehealth: Payer: Self-pay | Admitting: Cardiology

## 2019-11-14 NOTE — Telephone Encounter (Signed)
Spoke with the pt and informed her that if she becomes symptomatic, then she should go and get tested, otherwise she should take precautions with the 3 W's, and continue to monitor.  Pt verbalized understanding and agrees with this plan.

## 2019-11-14 NOTE — Telephone Encounter (Signed)
New Message  Patient is calling to see how long she should wait to get tested for COVID-19. States that she has a friend that has tested positive for it and she was around their kids. Please give patient a call back to discuss.

## 2019-11-24 ENCOUNTER — Other Ambulatory Visit: Payer: Self-pay | Admitting: Cardiovascular Disease

## 2019-11-25 ENCOUNTER — Other Ambulatory Visit: Payer: Self-pay | Admitting: Cardiovascular Disease

## 2019-11-26 NOTE — Telephone Encounter (Signed)
Refill Request.  

## 2020-01-11 ENCOUNTER — Encounter: Payer: Self-pay | Admitting: Family Medicine

## 2020-01-11 ENCOUNTER — Ambulatory Visit (INDEPENDENT_AMBULATORY_CARE_PROVIDER_SITE_OTHER): Payer: BC Managed Care – PPO

## 2020-01-11 ENCOUNTER — Ambulatory Visit (INDEPENDENT_AMBULATORY_CARE_PROVIDER_SITE_OTHER): Payer: Commercial Managed Care - HMO | Admitting: Family Medicine

## 2020-01-11 ENCOUNTER — Other Ambulatory Visit: Payer: Self-pay

## 2020-01-11 ENCOUNTER — Ambulatory Visit (INDEPENDENT_AMBULATORY_CARE_PROVIDER_SITE_OTHER): Payer: Commercial Managed Care - HMO

## 2020-01-11 VITALS — BP 137/84 | HR 73 | Temp 98.7°F | Ht 60.0 in | Wt 104.0 lb

## 2020-01-11 DIAGNOSIS — M20091 Other deformity of right finger(s): Secondary | ICD-10-CM

## 2020-01-11 DIAGNOSIS — M20092 Other deformity of left finger(s): Secondary | ICD-10-CM

## 2020-01-11 DIAGNOSIS — M25531 Pain in right wrist: Secondary | ICD-10-CM

## 2020-01-11 DIAGNOSIS — M25532 Pain in left wrist: Secondary | ICD-10-CM | POA: Diagnosis not present

## 2020-01-11 DIAGNOSIS — M659 Synovitis and tenosynovitis, unspecified: Secondary | ICD-10-CM

## 2020-01-11 MED ORDER — DICLOFENAC SODIUM 1 % EX GEL: 2.0000 g | Freq: Four times a day (QID) | CUTANEOUS | 1 refills | Status: AC

## 2020-01-11 NOTE — Patient Instructions (Addendum)
If you have lab work done today you will be contacted with your lab results within the next 2 weeks.  If you have not heard from [place] then please contact us. The fastest way to get your results is to register for My Chart.   IF you received an x-ray today, you will receive an invoice from [facility]. Please contact [facility] at [phone] with questions or concerns regarding your invoice.   IF you received labwork today, you will receive an invoice from [facility]. Please contact [facility] at [phone] with questions or concerns regarding your invoice.   Our billing staff will not be able to assist you with questions regarding bills from these companies.  You will be contacted with the lab results as soon as they are available. The fastest way to get your results is to activate your My Chart account. Instructions are located on the last page of this paperwork. If you have not heard from [place] regarding the results in 2 weeks, please contact this office.     De Quervain's Tenosynovitis  De Quervain's tenosynovitis is a condition that causes inflammation of the tendon on the thumb side of the wrist. Tendons are cords of tissue that connect bones to muscles. The tendons in the hand pass through a tunnel called a sheath. A slippery layer of tissue (synovium) lets the tendons move smoothly in the sheath. With de Quervain's tenosynovitis, the sheath swells or thickens, causing friction and pain. The condition is also called de Quervain's disease and de Quervain's syndrome. It occurs most often in women who are 53-69 years old. What are the causes? The exact cause of this condition is not known. It may be associated with overuse of the hand and wrist. What increases the risk? You are more likely to develop this condition if you:  Use your hands far more than normal, especially if you repeat certain movements that involve twisting your hand or using a tight grip.  Are  pregnant.  Are a middle-aged woman.  Have rheumatoid arthritis.  Have diabetes. What are the signs or symptoms? The main symptom of this condition is pain on the thumb side of the wrist. The pain may get worse when you grasp something or turn your wrist. Other symptoms may include:  Pain that extends up the forearm.  Swelling of your wrist and hand.  Trouble moving the thumb and wrist.  A sensation of snapping in the wrist.  A bump filled with fluid (cyst) in the area of the pain. How is this diagnosed? This condition may be diagnosed based on:  Your symptoms and medical history.  A physical exam. During the exam, your health care provider may do a simple test Wynn Maudlin test) that involves pulling your thumb and wrist to see if this causes pain. You may also need to have an X-ray. How is this treated? Treatment for this condition may include:  Avoiding any activity that causes pain and swelling.  Taking medicines. Anti-inflammatory medicines and corticosteroid injections may be used to reduce inflammation and relieve pain.  Wearing a splint.  Having surgery. This may be needed if other treatments do not work. Once the pain and swelling has gone down:  Physical therapy. This includes stretching and strengthening exercises.  Occupational therapy. This includes adjusting how you move your wrist. Follow these instructions at home: If you have a splint:  Wear the splint as told by your health care provider. Remove it only as told by your  health care provider.  Loosen the splint if your fingers tingle, become numb, or turn cold and blue.  Keep the splint clean.  If the splint is not waterproof: ? Do not let it get wet. ? Cover it with a watertight covering when you take a bath or a shower. Managing pain, stiffness, and swelling   Avoid movements and activities that cause pain and swelling in the wrist area.  If directed, put ice on the painful area. This may be  helpful after doing activities that involve the sore wrist. ? Put ice in a plastic bag. ? Place a towel between your skin and the bag. ? Leave the ice on for 20 minutes, 2-3 times a day.  Move your fingers often to avoid stiffness and to lessen swelling.  Raise (elevate) the injured area above the level of your heart while you are sitting or lying down. General instructions  Return to your normal activities as told by your health care provider. Ask your health care provider what activities are safe for you.  Take over-the-counter and prescription medicines only as told by your health care provider.  Keep all follow-up visits as told by your health care provider. This is important. Contact a health care provider if:  Your pain medicine does not help.  Your pain gets worse.  You develop new symptoms. Summary  De Quervain's tenosynovitis is a condition that causes inflammation of the tendon on the thumb side of the wrist.  The condition occurs most often in women who are 56-34 years old.  The exact cause of this condition is not known. It may be associated with overuse of the hand and wrist.  Treatment starts with avoiding activity that causes pain or swelling in the wrist area. Other treatment may include wearing a splint and taking medicine. Sometimes, surgery is needed. This information is not intended to replace advice given to you by your health care provider. Make sure you discuss any questions you have with your health care provider. Document Revised: 05/25/2018 Document Reviewed: 10/31/2017 Elsevier Patient Education  2020 [employer].

## 2020-01-11 NOTE — Progress Notes (Signed)
2/5/202111:08 AM  Michelle Lloyd July 05, 1966, 54 y.o., female [id number]  Chief Complaint  Patient presents with  . Pain    pain in both wrist from using walker, thinks she may have carpel tunnel. Post hip surgery caused her to have to use walker. Taking tylenol for the pain    HPI:   Patient is a 54 y.o. female with past medical history significant for turners syndrome, repair of coarctation of aorta, HFrEF - 30%, h/o ICD in place, hip OAs/p THA who presents today for wrist pain  Patient reports bilateral wrist pain, Left > right for past several months No trauma Pain worse at base of thumb Swelling but no redness or warmth interfering with grip Sometimes radiates down into forearm No numbness or tingling Using APAP and rest Not using walker anymore, has recovered well from hip surgery  Depression screen [facility] 2/9 01/11/2020 09/27/2018 07/27/2018  Decreased Interest 0 0 0  Down, Depressed, Hopeless 0 0 0  PHQ - 2 Score 0 0 0    Fall Risk  01/11/2020 09/27/2018 07/27/2018 02/27/2018  Falls in the past year? 0 No No No  Number falls in past yr: 0 - - -  Injury with Fall? 0 - - -  Risk for fall due to : Impaired mobility;Impaired balance/gait - - -     Allergies  Allergen Reactions  . Lisinopril Nausea Only    Prior to Admission medications   Medication Sig Start Date End Date Taking? Authorizing Provider  co-enzyme Q-10 30 MG capsule Take by mouth.   Yes [provider]  ferrous sulfate 325 (65 FE) MG tablet Take 325 mg by mouth daily with breakfast.   Yes [provider]  furosemide (LASIX) 20 MG tablet Take 1 tablet 20 mg every other day. 10/05/19  Yes Dorothy Spark, MD  losartan (COZAAR) 25 MG tablet TAKE 1 TABLET BY MOUTH EVERY DAY 11/26/19  Yes Dorothy Spark, MD  Magnesium 400 MG CAPS Take 400 mg by mouth daily.   Yes [provider]  metoprolol succinate (TOPROL-XL) 25 MG 24 hr tablet TAKE 1 TABLET BY MOUTH EVERY DAY  11/26/19  Yes Skeet Latch, MD  Multiple Vitamin (MULTI-VITAMIN) tablet Take 1 tablet by mouth daily.   Yes [provider]  Omega-3 Fatty Acids (FISH OIL) 1000 MG CAPS Take by mouth.   Yes [provider]  spironolactone (ALDACTONE) 25 MG tablet Take 0.5 tablets (12.5 mg total) by mouth daily. 10/05/19  Yes Dorothy Spark, MD    Past Medical History:  Diagnosis Date  . Allergy   . Anemia   . Bilateral primary osteoarthritis of hip   . Bilateral wrist pain   . Blood transfusion without reported diagnosis   . Cardiac device in situ 07/06/2018   Biotronik BIVICD , right chest  . CHB (complete heart block) (HCC)    History of   . Chronic combined systolic and diastolic CHF (congestive heart failure) ([place])   . Complication of anesthesia    hard to wake up at times in past  . H/O prosthetic aortic valve replacement   . Heart murmur   . History of bronchitis   . History of cardiac arrest 01/2017  . History of repair of coarctation of aorta   . History of sustained ventricular tachycardia   . History of ventricular fibrillation 01/2017  . Hyperlipidemia   . Hypertension   . Non-ischemic cardiomyopathy ([place])   . Pneumonia   . PONV (  postoperative nausea and vomiting)   . Presence of permanent cardiac pacemaker    right chest  . Pulmonary nodules   . Turner syndrome   . Wears hearing aid in both ears     Past Surgical History:  Procedure Laterality Date  . CARDIAC ASSIST DEVICE REMOVAL     device got infected  . DENTAL SURGERY    . EYE SURGERY     Left eye lazy eye, childhood  . PACEMAKER INSERTION     located on right side  . TONSILLECTOMY    . TOTAL HIP ARTHROPLASTY Left 11/27/2018   Procedure: TOTAL HIP ARTHROPLASTY ANTERIOR APPROACH;  Surgeon: Ollen Gross, MD;  Location: [facility];  Service: Orthopedics;  Laterality: Left;   . TOTAL HIP ARTHROPLASTY Right 05/02/2019   Procedure: TOTAL HIP ARTHROPLASTY ANTERIOR APPROACH;  Surgeon: Ollen Gross, MD;  Location: [facility];  Service: Orthopedics;  Laterality: Right;     Social History   Tobacco Use  . Smoking status: Never Smoker  . Smokeless tobacco: Never Used  Substance Use Topics  . Alcohol use: Not Currently    Family History  Problem Relation Age of Onset  . Breast cancer Mother   . Cataracts Mother   . Stroke Father   . Atrial fibrillation Father   . Kidney disease Sister     ROS Per hpi  OBJECTIVE:  Today's Vitals   01/11/20 1056  BP: 137/84  Pulse: 73  Temp: 98.7 F (37.1 C)  SpO2: 100%  Weight: 104 lb (47.2 kg)  Height: 5' (1.524 m)   Body mass index is 20.31 kg/m.   Physical Exam Vitals and nursing note reviewed.  Constitutional:      Appearance: She is well-developed.  HENT:     Head: Normocephalic and atraumatic.  Eyes:     General: No scleral icterus.    Conjunctiva/sclera: Conjunctivae normal.     Pupils: Pupils are equal, round, and reactive to light.  Pulmonary:     Effort: Pulmonary effort is normal.  Musculoskeletal:     Right wrist: Tenderness present. No swelling, deformity or bony tenderness. Normal range of motion. Normal pulse.     Left wrist: Swelling and tenderness present. No deformity or bony tenderness. Normal range of motion. Normal pulse.     Cervical back: Neck supple.     Comments: Neg tinsel, phalen exam Pos finkelstein exam Ulnar deviation, L>R  Skin:    General: Skin is warm and dry.  Neurological:     Mental Status: She is alert and oriented to person, place, and time.       No results found for this or any previous visit (from the past 24 hour(s)).  DG Wrist Complete Left  Result Date: 01/11/2020 CLINICAL DATA:  Worsening wrist pain. No injury. History of Turner syndrome. EXAM: LEFT WRIST - COMPLETE 3+ VIEW COMPARISON:  No prior. FINDINGS: Diffuse osteopenia. Prominent diffuse radiocarpal, intercarpal and carpometacarpal degenerative changes noted. Cystic changes noted about the subchondral  regions of the distal radius, the carpals, and the base of the metacarpals. Although these changes could be degenerative crystal induced arthropathy including CPPD and gout cannot be excluded. Superimposed erosive arthropathy cannot be excluded. No evidence of fracture. Soft tissue swelling is noted. No radiopaque foreign body. IMPRESSION: 1. Diffuse osteopenia. Prominent diffuse degenerative change. Multiple subchondral cysts are noted about the wrist as described above. Although these changes may be degenerative crystal induced arthropathy including CPPD and gout cannot be excluded. Superimposed erosive  arthropathy cannot be excluded. 2. Soft tissue swelling is noted about the left wrist. No evidence of fracture or dislocation. Electronically Signed   By: Maisie Fus  Register   On: 01/11/2020 11:38     ASSESSMENT and PLAN  1. Bilateral wrist pain - DG Wrist Complete Left; Future - DG Wrist Complete Right; Future - Sedimentation Rate - Uric Acid - Rheumatoid factor - C-reactive protein - ANA W/Rfx to all if Positive - Apply other splint  2. 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 Tenosynovitis of both wrists Discussed supportive measures, new meds r/se/b and RTC precautions. Patient educational handout given. Other orders - diclofenac Sodium (VOLTAREN) 1 % GEL; Apply 2 g topically 4 (four) times daily.  Return if symptoms worsen or fail to improve.    Myles Lipps, MD Primary Care at [facility] [address] [place], [place] [address] Ph.  [phone] Fax [phone]

## 2020-01-12 LAB — RHEUMATOID FACTOR: Rheumatoid fact SerPl-aCnc: <10 IU/mL (ref 0.0–13.9)

## 2020-01-12 LAB — URIC ACID: Uric Acid: 4.1 mg/dL (ref 3.0–7.2)

## 2020-01-12 LAB — C-REACTIVE PROTEIN: CRP: 51 mg/L — ABNORMAL HIGH (ref 0–10)

## 2020-01-12 LAB — SEDIMENTATION RATE: Sed Rate: 96 mm/hr — ABNORMAL HIGH (ref 0–40)

## 2020-01-12 LAB — ANA W/RFX TO ALL IF POSITIVE: Anti Nuclear Antibody (ANA): NEGATIVE

## 2020-01-23 ENCOUNTER — Telehealth: Payer: Self-pay | Admitting: Family Medicine

## 2020-01-23 NOTE — Telephone Encounter (Signed)
Pt received her lab results via mychart and requires assistance understanding the results. Please f/u with pt

## 2020-01-25 ENCOUNTER — Telehealth: Payer: Self-pay | Admitting: Family Medicine

## 2020-01-25 NOTE — Telephone Encounter (Signed)
I have called pt back and informed her that her Voltaren is OTC so insurance is not going to over the mediation. Pt stated understanding. I have also given pt clarification on lab results.

## 2020-01-25 NOTE — Telephone Encounter (Signed)
Attempted to call pt and no answer. I left a general message to call back if needed. I have also informed pt to check mychart message.   Plan: provider response for Lab Result "Elevated inflammatory markers but no other evidence of autoimmune source of arthritis."

## 2020-01-25 NOTE — Telephone Encounter (Signed)
Please call patient back to go over lab results . Patient also mentioned Dr.Santiago was going to call in some anti inflammatory medicine . Topical Please call patient back

## 2020-01-28 ENCOUNTER — Ambulatory Visit: Payer: 59 | Admitting: Cardiology

## 2020-01-28 ENCOUNTER — Encounter: Payer: Self-pay | Admitting: Cardiology

## 2020-01-28 ENCOUNTER — Other Ambulatory Visit: Payer: Self-pay

## 2020-01-28 VITALS — BP 118/72 | HR 79 | Ht 60.0 in | Wt 103.8 lb

## 2020-01-28 DIAGNOSIS — Q251 Coarctation of aorta: Secondary | ICD-10-CM | POA: Diagnosis not present

## 2020-01-28 DIAGNOSIS — I509 Heart failure, unspecified: Secondary | ICD-10-CM | POA: Diagnosis not present

## 2020-01-28 DIAGNOSIS — R911 Solitary pulmonary nodule: Secondary | ICD-10-CM | POA: Diagnosis not present

## 2020-01-28 MED ORDER — ENTRESTO 24-26 MG PO TABS: 1.0000 | ORAL_TABLET | Freq: Two times a day (BID) | ORAL | 12 refills | Status: AC

## 2020-01-28 NOTE — Patient Instructions (Addendum)
Medication Instructions:  Your physician has recommended you make the following change in your medication: 1-START Entresto 26/24 mg by mouth twice daily, starting on 01/31/20 2-STOP Losartan 3-STOP aldactone   *If you need a refill on your cardiac medications before your next appointment, please call your pharmacy*  Lab Work: Your physician recommends that you have lab work today- BMET and BNP  If you have labs (blood work) drawn today and your tests are completely normal, you will receive your results only by: Marland Kitchen MyChart Message (if you have MyChart) OR . A paper copy in the mail If you have any lab test that is abnormal or we need to change your treatment, we will call you to review the results.  Testing/Procedures: Chest CT Angiography (CTA), is a special type of CT scan that uses a computer to produce multi-dimensional views of major blood vessels throughout the body. In CT angiography, a contrast material is injected through an IV to help visualize the blood vessels  Follow-Up: At [facility], you and your health needs are our priority.  As part of our continuing mission to provide you with exceptional heart care, we have created designated Provider Care Teams.  These Care Teams include your primary Cardiologist (physician) and Advanced Practice Providers (APPs -  Physician Assistants and Nurse Practitioners) who all work together to provide you with the care you need, when you need it.  Your next appointment:   2 month(s)  The format for your next appointment:   In Person  Provider:   You may see Dr. Delton See or one of the following Advanced Practice Providers on your designated Care Team:    Ronie Spies, PA-C  Jacolyn Reedy, PA-C

## 2020-01-28 NOTE — Progress Notes (Addendum)
Cardiology Office Note:    Date:  01/29/2020   ID:  [name], [place] 02-Jun-1966, MRN [id number]  PCP:  Myles Lipps, MD  Cardiologist:  Chilton Si, MD  Electrophysiologist:  None   Referring MD: Myles Lipps, MD   Reason for visit: 4 months follow-up  History of Present Illness:    Michelle Lloyd is a 54 y.o. female with a hx of chronic systolic and diastolic heart failure, Turner syndrome, repair of coarctation of the aorta 1980, bicuspid aortic valve status post aortic valve replacement, ventricular tachycardia s/p ICD, hypertension, pulmonary nodules who presents for follow-up.  History is outlined in Dr. Leonides Sake note from 04/2019 and also derived from [facility]. [name] underwent repair of coarctation of the aorta in 1980 with a Gortex graft between descending aorta and left subclavian artery.She developed nonischemic cardiomyopathy. She had a bicuspid aortic valve and underwent bioprosthetic AVR in 2012. 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 She had sustained VT/VF arrest 02/2011 one week after AVR. It is mentioned this possibly occurred in 2018 but she refutes this. She had a biventricular ICD placed 02/2011. She had an RV lead fracture and a left innominate vein occlusion in 2016.She underwent laser lead extraction and she subsequently underwent implantation of a right-sided Biotronik ICD 06/2015. Her LVEF improved to 50% despite downgrade to a single lead ICD. She had a device infection 09/2017.Her ICD was removed and replaced at [facility].She had a repeat echo 11/2017 that reportedly showed a reduction in her LVEF to 30%. She then had an echoat [facility] 05/2018 that revealed LVEF 30% with global hypokinesis with exception of normal wall motion in the posterior myocardium and akinesis of the septum. She underwent BiV upgrade 07/2018. Her device is on the right. At her post op device check the LV lead was  not capturing so she had a lead revision 09/02/18.She was due to have a repeat echo at [facility] in 06/2019 but I do not see this occurred yet. Labs 04/2019 showed K 3.4, Cr 0.53, Hgb 7.7 (in setting of hip replacement), LFTs 02/2019 wnl, 02/2018 LDL 131 (followed by PCP). In 10/2017 she had a CT which showed innumerable pulmonary nodules with recommendation for f/u CT 6 months, coarctation s/p surgical changes.  10/05/2019 -today she states that she has been doing well, she still feels fatigued but she is able to walk every day 20 to 30 minutes a day, with no significant shortness of breath and no chest pain.  She has no lower extremity edema no orthopnea proximal nocturnal dyspnea.  No palpitation dizziness falls or syncope.  She was prescribed Entresto however her co-pay was over $1000 and she stayed on losartan.  01/28/2020 the patient is coming after 4 months, she states she overall feels well, she tries to exercise with no chest pain and no significant shortness of breath.  She has been tolerating her medications, she denies any lower extremity edema orthopnea or proximal nocturnal dyspnea.  Past Medical History:  Diagnosis Date  . Allergy   . Anemia   . Bilateral primary osteoarthritis of hip   . Bilateral wrist pain   . Blood transfusion without reported diagnosis   . Cardiac device in situ 07/06/2018   Biotronik BIVICD , right chest  . CHB (complete heart block) (HCC)    History of   . Chronic combined systolic and diastolic CHF (congestive heart failure) (HCC)   . Complication of anesthesia  hard to wake up at times in past  . H/O prosthetic aortic valve replacement   . Heart murmur   . History of bronchitis   . History of cardiac arrest 01/2017  . History of repair of coarctation of aorta   . History of sustained ventricular tachycardia   . History of ventricular fibrillation 01/2017  . Hyperlipidemia   . Hypertension   . Non-ischemic cardiomyopathy (HCC)   . Pneumonia   . PONV  (postoperative nausea and vomiting)   . Presence of permanent cardiac pacemaker    right chest  . Pulmonary nodules   . Turner syndrome   . Wears hearing aid in both ears     Past Surgical History:  Procedure Laterality Date  . CARDIAC ASSIST DEVICE REMOVAL     device got infected  . DENTAL SURGERY    . EYE SURGERY     Left eye lazy eye, childhood  . PACEMAKER INSERTION     located on right side  . TONSILLECTOMY    . TOTAL HIP ARTHROPLASTY Left 11/27/2018   Procedure: TOTAL HIP ARTHROPLASTY ANTERIOR APPROACH;  Surgeon: Ollen Gross, MD;  Location: [facility];  Service: Orthopedics;  Laterality: Left;   . TOTAL HIP ARTHROPLASTY Right 05/02/2019   Procedure: TOTAL HIP ARTHROPLASTY ANTERIOR APPROACH;  Surgeon: Ollen Gross, MD;  Location: [facility];  Service: Orthopedics;  Laterality: Right;     Current Medications: Current Meds  Medication Sig  . acetaminophen (TYLENOL) 650 MG CR tablet Take 650 mg by mouth every 8 (eight) hours as needed for pain.  . Ascorbic Acid (VITAMIN C) 1000 MG tablet Take 1,000 mg by mouth daily.  Marland Kitchen aspirin 81 MG EC tablet Take 81 mg by mouth daily. Swallow whole.  . calcium-vitamin D (OSCAL WITH D) 500-200 MG-UNIT tablet Take 1 tablet by mouth daily.  Marland Kitchen co-enzyme Q-10 30 MG capsule Take by mouth.  . diclofenac Sodium (VOLTAREN) 1 % GEL Apply 2 g topically 4 (four) times daily.  . ferrous sulfate 325 (65 FE) MG tablet Take 325 mg by mouth daily with breakfast.  . furosemide (LASIX) 20 MG tablet Take 20 mg by mouth daily.  . Magnesium 400 MG CAPS Take 400 mg by mouth daily.  . metoprolol succinate (TOPROL-XL) 25 MG 24 hr tablet TAKE 1 TABLET BY MOUTH EVERY DAY  . Multiple Vitamin (MULTI-VITAMIN) tablet Take 1 tablet by mouth daily.  . Omega-3 Fatty Acids (FISH OIL) 1000 MG CAPS Take by mouth daily.   . potassium chloride SA (KLOR-CON) 20 MEQ tablet Take 20 mEq by mouth daily.  Marland Kitchen UNABLE TO FIND Take 2,000 mg by mouth daily. xtendovite  .  [DISCONTINUED] losartan (COZAAR) 25 MG tablet TAKE 1 TABLET BY MOUTH EVERY DAY  . [DISCONTINUED] spironolactone (ALDACTONE) 25 MG tablet Take 0.5 tablets (12.5 mg total) by mouth daily.     Allergies:   Lisinopril   Social History   Socioeconomic History  . Marital status: Married    Spouse name: Not on file  . Number of children: Not on file  . Years of education: Not on file  . Highest education level: Not on file  Occupational History  . Not on file  Tobacco Use  . Smoking status: Never Smoker  . Smokeless tobacco: Never Used  Substance and Sexual Activity  . Alcohol use: Not Currently  . Drug use: Never  . Sexual activity: Yes    Partners: Male    Birth control/protection: Post-menopausal  Other Topics  Concern  . Not on file  Social History Narrative   02/2018:   Married   3 adopted children, ages 34-9yo   Social Determinants of Health   Financial Resource Strain:   . Difficulty of Paying Living Expenses: Not on file  Food Insecurity:   . Worried About Charity fundraiser in the Last Year: Not on file  . Ran Out of Food in the Last Year: Not on file  Transportation Needs:   . Lack of Transportation (Medical): Not on file  . Lack of Transportation (Non-Medical): Not on file  Physical Activity:   . Days of Exercise per Week: Not on file  . Minutes of Exercise per Session: Not on file  Stress:   . Feeling of Stress : Not on file  Social Connections:   . Frequency of Communication with Friends and Family: Not on file  . Frequency of Social Gatherings with Friends and Family: Not on file  . Attends Religious Services: Not on file  . Active Member of Clubs or Organizations: Not on file  . Attends Archivist Meetings: Not on file  . Marital Status: Not on file     Family History: The patient's family history includes Atrial fibrillation in her father; Breast cancer in her mother; Cataracts in her mother; Kidney disease in her sister; Stroke in her  father.  ROS:   Please see the history of present illness.    All other systems reviewed and are negative.  EKGs/Labs/Other Studies Reviewed:    The following studies were reviewed today:  EKG:  EKG is ordered today.  It shows a sensed ventricular paced rhythm unchanged from prior.  TTE: 08/31/2019 1. Left ventricular ejection fraction, by visual estimation, is 35 to  40%. The left ventricle has moderately decreased function. Normal left  ventricular size. There is moderately increased left ventricular  hypertrophy.  2. Elevated mean left atrial pressure.  3. Left ventricular diastolic Doppler parameters are consistent with  impaired relaxation pattern of LV diastolic filling.  4. Global right ventricle has normal systolic function.The right  ventricular size is normal.  5. Left atrial size was normal.  6. Right atrial size was normal.  7. Mild mitral annular calcification.  8. The mitral valve is grossly normal. Trace mitral valve regurgitation.  No evidence of mitral stenosis.  9. The tricuspid valve is normal in structure. Tricuspid valve  regurgitation is trivial.  10. Aortic valve regurgitation was not visualized by color flow Doppler.  11. The pulmonic valve was not well visualized. Pulmonic valve  regurgitation is trivial by color flow Doppler.  12. Normal pulmonary artery systolic pressure.  13. A pacer wire is visualized in the RV.  14. The inferior vena cava is normal in size with greater than 50%  respiratory variability, suggesting right atrial pressure of 3 mmHg.    Recent Labs: 04/27/2019: ALT 18 08/30/2019: Hemoglobin 10.9; Magnesium 2.0; Platelets 310 01/28/2020: BUN 17; Creatinine, Ser 0.67; NT-Pro BNP 506; Potassium 4.5; Sodium 140  Recent Lipid Panel    Component Value Date/Time   CHOL 238 (H) 02/27/2018 1153   TRIG 135 02/27/2018 1153   HDL 80 02/27/2018 1153   CHOLHDL 3.0 02/27/2018 1153   LDLCALC 131 (H) 02/27/2018 1153    Physical Exam:     VS:  BP 118/72   Pulse 79   Ht 5' (1.524 m)   Wt 103 lb 12.8 oz (47.1 kg)   LMP  (LMP Unknown)   SpO2 97%  BMI 20.27 kg/m     Wt Readings from Last 3 Encounters:  01/28/20 103 lb 12.8 oz (47.1 kg)  01/11/20 104 lb (47.2 kg)  10/05/19 99 lb (44.9 kg)     GEN:  Well nourished, well developed in no acute distress HEENT: Normal NECK: No JVD; No carotid bruits LYMPHATICS: No lymphadenopathy CARDIAC: RRR, loud holosystolic murmur in the left upper sternal border, rubs, gallops RESPIRATORY:  Clear to auscultation without rales, wheezing or rhonchi  ABDOMEN: Soft, non-tender, non-distended MUSCULOSKELETAL:  No edema; No deformity  SKIN: Warm and dry NEUROLOGIC:  Alert and oriented x 3 PSYCHIATRIC:  Normal affect   ASSESSMENT:    1. Coarctation of aorta   2. Lung nodule   3. Congestive heart failure, unspecified HF chronicity, unspecified heart failure type (HCC)    PLAN:    In order of problems listed above:  1. Chronic combined CHF with recent exertional dyspnea -she appears euvolemic, will discontinue losartan and start Entresto 2624 mg p.o. twice daily we will discontinue spironolactone 12.5 mg daily as her blood pressure is rather low, and arrange for blood work 4 weeks from now.   2. Coarctation of the aorta - s/p repair in [place]. Of note, she also had pulmonary nodules on a CT in 2018.  We will arrange for chest CTA to evaluate for coarctation she has murmur in the area, chest CT will also re-evaluate pulmonary nodules. 3. History of AVR - SBE ppx reviewed.  No AI, mean gradient of 27 mmHg, peak gradient 44 mmHg on the echo in September 2020.  We will repeat in a year.   Medication Adjustments/Labs and Tests Ordered: Current medicines are reviewed at length with the patient today.  Concerns regarding medicines are outlined above.  Orders Placed This Encounter  Procedures  . CT ANGIO CHEST AORTA W/CM & OR WO/CM  . Basic metabolic panel  . Pro b natriuretic peptide  (BNP)  . EKG 12-Lead   Meds ordered this encounter  Medications  . sacubitril-valsartan (ENTRESTO) 24-26 MG    Sig: Take 1 tablet by mouth 2 (two) times daily.    Dispense:  60 tablet    Refill:  12    Please use [facility] for this prescription. RXBINMarland Kitchen [id number] RXGRP: [id number] RXPCN: [id number] AQTMAU:[id number] KT:[id number]    Patient Instructions  Medication Instructions:  Your physician has recommended you make the following change in your medication: 1-START Entresto 26/24 mg by mouth twice daily, starting on 01/31/20 2-STOP Losartan 3-STOP aldactone   *If you need a refill on your cardiac medications before your next appointment, please call your pharmacy*  Lab Work: Your physician recommends that you have lab work today- BMET and BNP  If you have labs (blood work) drawn today and your tests are completely normal, you will receive your results only by: Marland Kitchen MyChart Message (if you have MyChart) OR . A paper copy in the mail If you have any lab test that is abnormal or we need to change your treatment, we will call you to review the results.  Testing/Procedures: Chest CT Angiography (CTA), is a special type of CT scan that uses a computer to produce multi-dimensional views of major blood vessels throughout the body. In CT angiography, a contrast material is injected through an IV to help visualize the blood vessels  Follow-Up: At [facility], you and your health needs are our priority.  As part of our continuing mission to provide you with exceptional heart care, we have created designated Provider  Care Teams.  These Care Teams include your primary Cardiologist (physician) and Advanced Practice Providers (APPs -  Physician Assistants and Nurse Practitioners) who all work together to provide you with the care you need, when you need it.  Your next appointment:   2 month(s)  The format for your next appointment:   In Person  Provider:   You may see Dr. Delton See or one of the following  Advanced Practice Providers on your designated Care Team:    Ronie Spies, PA-C  Jacolyn Reedy, PA-C      Signed, Tobias Alexander, MD  01/29/2020 11:17 PM    [facility]

## 2020-01-29 LAB — BASIC METABOLIC PANEL
BUN/Creatinine Ratio: 25 — ABNORMAL HIGH (ref 9–23)
BUN: 17 mg/dL (ref 6–24)
CO2: 20 mmol/L (ref 20–29)
Calcium: 9.3 mg/dL (ref 8.7–10.2)
Chloride: 103 mmol/L (ref 96–106)
Creatinine, Ser: 0.67 mg/dL (ref 0.57–1.00)
GFR calc Af Amer: 114 mL/min/{1.73_m2} (ref >59)
GFR calc non Af Amer: 99 mL/min/{1.73_m2} (ref >59)
Glucose: 90 mg/dL (ref 65–99)
Potassium: 4.5 mmol/L (ref 3.5–5.2)
Sodium: 140 mmol/L (ref 134–144)

## 2020-01-29 LAB — PRO B NATRIURETIC PEPTIDE: NT-Pro BNP: 506 pg/mL — ABNORMAL HIGH (ref 0–287)

## 2020-01-30 ENCOUNTER — Telehealth: Payer: Self-pay

## 2020-01-30 DIAGNOSIS — I509 Heart failure, unspecified: Secondary | ICD-10-CM

## 2020-01-30 DIAGNOSIS — Z79899 Other long term (current) drug therapy: Secondary | ICD-10-CM

## 2020-01-30 NOTE — Telephone Encounter (Signed)
Pt verbalized understanding of her lab results. Will return 02/27/20 for repeat labs.

## 2020-01-30 NOTE — Telephone Encounter (Signed)
-----   Message from Lars Masson, MD sent at 01/29/2020 11:34 PM EST ----- Please discontinue potassium chloride, please repeat BMP and BNP in 4 weeks.

## 2020-02-07 ENCOUNTER — Inpatient Hospital Stay: Admission: RE | Admit: 2020-02-07 | Payer: 59 | Source: Ambulatory Visit

## 2020-02-11 ENCOUNTER — Encounter: Payer: Self-pay | Admitting: Family Medicine

## 2020-02-11 ENCOUNTER — Telehealth: Payer: Self-pay

## 2020-02-11 NOTE — Telephone Encounter (Signed)
**Note De-Identified Maleka Contino Obfuscation** Message received through covermymeds: [facility] Key: [id number] - PA Case ID: [id number] Outcome  Approved today  Request Reference Number: [id number].  ENTRESTO TAB 24-26MG  is approved through 02/10/2021.  Your patient may now fill this prescription and it will be covered.  DrugEntresto 24-26MG  tablets  FormOptumRx Electronic Prior Authorization Form [id number] NCPDP)   I have notified [facility] and the pt of this approval.

## 2020-02-11 NOTE — Telephone Encounter (Signed)
I started a Entresto PA trough covermymeds: Key: [id number]

## 2020-02-12 NOTE — Telephone Encounter (Signed)
Please Advise

## 2020-02-21 ENCOUNTER — Telehealth: Payer: Self-pay | Admitting: *Deleted

## 2020-02-21 NOTE — Telephone Encounter (Signed)
CT angio chest Received: Today Message Contents  Edgar Frisk, LPN  FYI,   Patient canceled CT and did not want to reschedule.    Will route this information about pt not wanting to proceed with rescheduling her CT Angio Chest to Dr. Delton See as a general FYI.

## 2020-02-27 ENCOUNTER — Other Ambulatory Visit: Payer: 59 | Admitting: *Deleted

## 2020-02-27 ENCOUNTER — Encounter: Payer: Self-pay | Admitting: Family Medicine

## 2020-02-27 ENCOUNTER — Other Ambulatory Visit: Payer: Self-pay

## 2020-02-27 DIAGNOSIS — Z79899 Other long term (current) drug therapy: Secondary | ICD-10-CM

## 2020-02-27 DIAGNOSIS — I509 Heart failure, unspecified: Secondary | ICD-10-CM

## 2020-02-28 DIAGNOSIS — I509 Heart failure, unspecified: Secondary | ICD-10-CM

## 2020-02-28 DIAGNOSIS — Z79899 Other long term (current) drug therapy: Secondary | ICD-10-CM

## 2020-02-28 LAB — PRO B NATRIURETIC PEPTIDE: NT-Pro BNP: 457 pg/mL — ABNORMAL HIGH (ref 0–287)

## 2020-02-28 LAB — BASIC METABOLIC PANEL
BUN/Creatinine Ratio: 30 — ABNORMAL HIGH (ref 9–23)
BUN: 22 mg/dL (ref 6–24)
CO2: 19 mmol/L — ABNORMAL LOW (ref 20–29)
Calcium: 9.4 mg/dL (ref 8.7–10.2)
Chloride: 99 mmol/L (ref 96–106)
Creatinine, Ser: 0.73 mg/dL (ref 0.57–1.00)
GFR calc Af Amer: 106 mL/min/{1.73_m2} (ref >59)
GFR calc non Af Amer: 92 mL/min/{1.73_m2} (ref >59)
Glucose: 89 mg/dL (ref 65–99)
Potassium: 4.1 mmol/L (ref 3.5–5.2)
Sodium: 138 mmol/L (ref 134–144)

## 2020-02-28 MED ORDER — SPIRONOLACTONE 25 MG PO TABS: 12.5000 mg | ORAL_TABLET | Freq: Every day | ORAL | 3 refills | Status: DC

## 2020-02-28 NOTE — Telephone Encounter (Signed)
I spoke with patient regarding message she sent. She reports she has been taking losartan and spironolactone until today. She did not take these today. She started Entresto this morning.  She states she meant to ask in her message if she should stop losartan or spironolactone.  I told her she should stop both these medications. I told patient I would send message to Dr Delton See to clarify and we would call her back if any changes needed to be made.

## 2020-02-28 NOTE — Telephone Encounter (Signed)
Lars Masson, MD  You 3 minutes ago (12:37 PM)   , she should just continue   Message text   You  Lars Masson, MD 1 hour ago (11:10 AM)   Should she stop taking Entresto and wait until Saturday to resume? She took one dose this morning.   Message text   Lars Masson, MD  You 1 hour ago (10:58 AM)   R[place], just tell her to get rid of losartan, thank you   Message text   You  You; Lars Masson, MD 1 hour ago (10:48 AM)   She had already taken [facility] today prior to sending her first message. She took losartan and spironolactone yesterday but not today   Message text   Lars Masson, MD  You 2 hours ago (10:36 AM)   Yes, absolutely, she should stop losartan and not start taking entresto till Saturday, she can continue taking spironolactone.   We will have to repeat BMP in 1-2 weeks.     Message sent to patient through my chart with instructions. Lab work scheduled for April 5,2021

## 2020-03-10 ENCOUNTER — Other Ambulatory Visit: Payer: Self-pay

## 2020-03-10 ENCOUNTER — Other Ambulatory Visit: Payer: 59 | Admitting: *Deleted

## 2020-03-10 DIAGNOSIS — Z79899 Other long term (current) drug therapy: Secondary | ICD-10-CM

## 2020-03-10 DIAGNOSIS — I509 Heart failure, unspecified: Secondary | ICD-10-CM

## 2020-03-10 LAB — BASIC METABOLIC PANEL
BUN/Creatinine Ratio: 23 (ref 9–23)
BUN: 15 mg/dL (ref 6–24)
CO2: 22 mmol/L (ref 20–29)
Calcium: 9 mg/dL (ref 8.7–10.2)
Chloride: 101 mmol/L (ref 96–106)
Creatinine, Ser: 0.65 mg/dL (ref 0.57–1.00)
GFR calc Af Amer: 115 mL/min/{1.73_m2} (ref >59)
GFR calc non Af Amer: 100 mL/min/{1.73_m2} (ref >59)
Glucose: 155 mg/dL — ABNORMAL HIGH (ref 65–99)
Potassium: 4.1 mmol/L (ref 3.5–5.2)
Sodium: 137 mmol/L (ref 134–144)

## 2020-04-17 ENCOUNTER — Ambulatory Visit: Payer: Self-pay | Admitting: Cardiology

## 2020-06-03 ENCOUNTER — Encounter: Payer: Self-pay | Admitting: Cardiology

## 2020-06-03 ENCOUNTER — Telehealth: Payer: Self-pay | Admitting: Cardiology

## 2020-06-03 NOTE — Telephone Encounter (Signed)
No I did not call this pt. Thanks

## 2020-06-03 NOTE — Telephone Encounter (Signed)
Patient wanted to inform Dr. Delton See that she has moved to [facility] and is also wanting to know if Dr. Delton See can recommend a cardiologist in her area. Please call.

## 2020-06-03 NOTE — Telephone Encounter (Signed)
Dr. Delton See any recommendations on Cardiologist in this area?

## 2020-06-03 NOTE — Telephone Encounter (Signed)
error 

## 2020-06-03 NOTE — Telephone Encounter (Signed)
Follow up   Opt called back she said Dr. Delton See nurse called her and asking for fax number of her new pharmacy in [place]. She gave fax# [phone] [address], [place], [place] [address]. She said Dr. Delton See agreed to refill all her heart meds for 30 days

## 2020-06-04 ENCOUNTER — Other Ambulatory Visit: Payer: Self-pay

## 2020-06-04 MED ORDER — SPIRONOLACTONE 25 MG PO TABS: 12.5000 mg | ORAL_TABLET | Freq: Every day | ORAL | 2 refills | Status: AC

## 2020-06-04 MED ORDER — METOPROLOL SUCCINATE ER 25 MG PO TB24: 25.0000 mg | ORAL_TABLET | Freq: Every day | ORAL | 2 refills | Status: AC

## 2020-06-04 NOTE — Telephone Encounter (Signed)
Follow Up ° °Patient is returning call. Please give patient a call back.  °

## 2020-06-04 NOTE — Telephone Encounter (Signed)
Patient returning call.

## 2020-06-04 NOTE — Telephone Encounter (Signed)
Pt was calling in asking if Dr. Delton See would be ok refilling her cardiac meds for the next 30 days, until she see's her new Cardiologist in [place].  [facility], and [facility] lives, and she hasn't checked in with them yet to see if they can fill her meds out of state.  She states she will call [facility] in [place] and ask them if they can see all her medications she had filled with them, back when she was in [place].  She states she will only call us back, if [facility] in [place], [place] has a problem with pulling her prescriptions through.  Informed the pt that would be fine and we will await her call back as needed.  Pt gracious for all the assistance provided.

## 2020-06-04 NOTE — Telephone Encounter (Signed)
Left the pt a message to call back, to further assist with request.

## 2020-08-31 ENCOUNTER — Other Ambulatory Visit: Payer: Self-pay | Admitting: Physician Assistant

## 2020-09-01 MED ORDER — FUROSEMIDE 20 MG PO TABS: 20.0000 mg | ORAL_TABLET | Freq: Every day | ORAL | 4 refills | Status: AC

## 2020-09-01 NOTE — Addendum Note (Signed)
Addended by: Margaret Pyle D on: 09/01/2020 09:58 AM   Modules accepted: Orders

## 2020-12-06 ENCOUNTER — Other Ambulatory Visit: Payer: Self-pay | Admitting: Cardiology

## 2021-03-09 ENCOUNTER — Other Ambulatory Visit: Payer: Self-pay | Admitting: Cardiology

## 2021-09-22 IMAGING — DX DG WRIST COMPLETE 3+V*L*
4 series · 4 of 4 positions shown · non-contrast
Comparison: No prior.

CLINICAL DATA: Worsening wrist pain. No injury. History of Turner
syndrome.

EXAM:
LEFT WRIST - COMPLETE 3+ VIEW

[wrist pa]
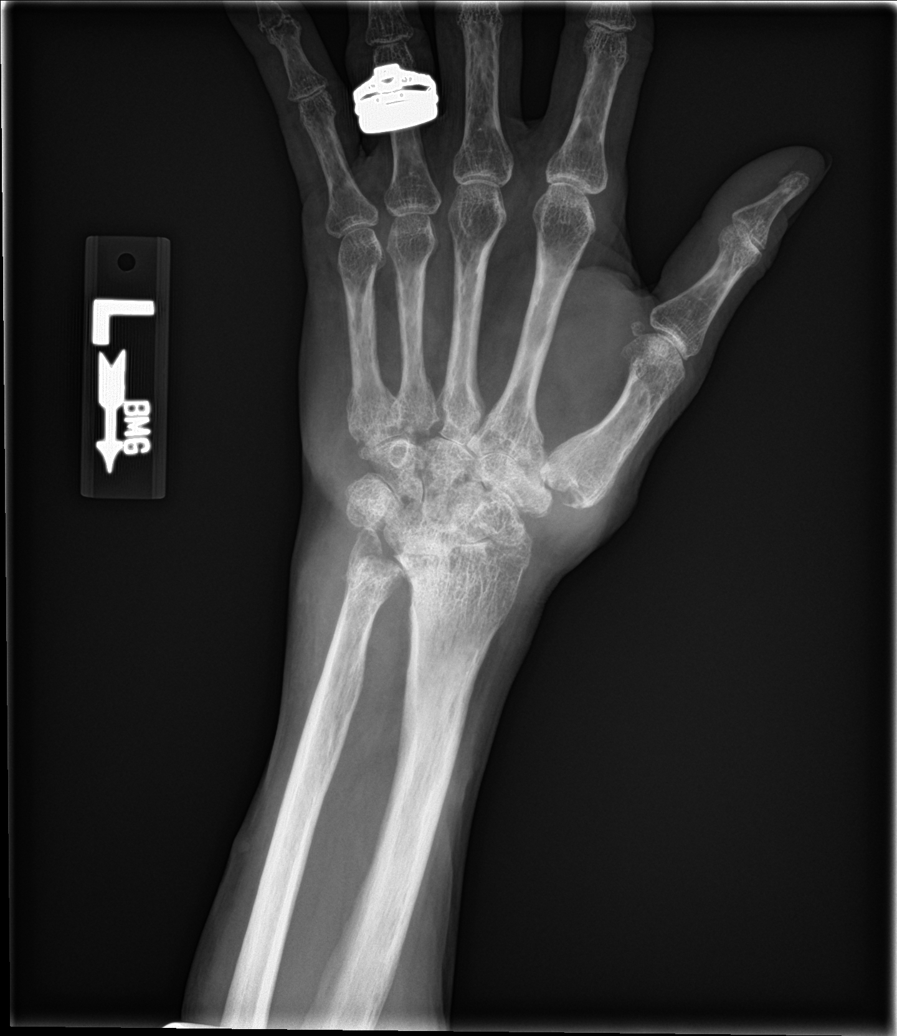

[wrist obl]
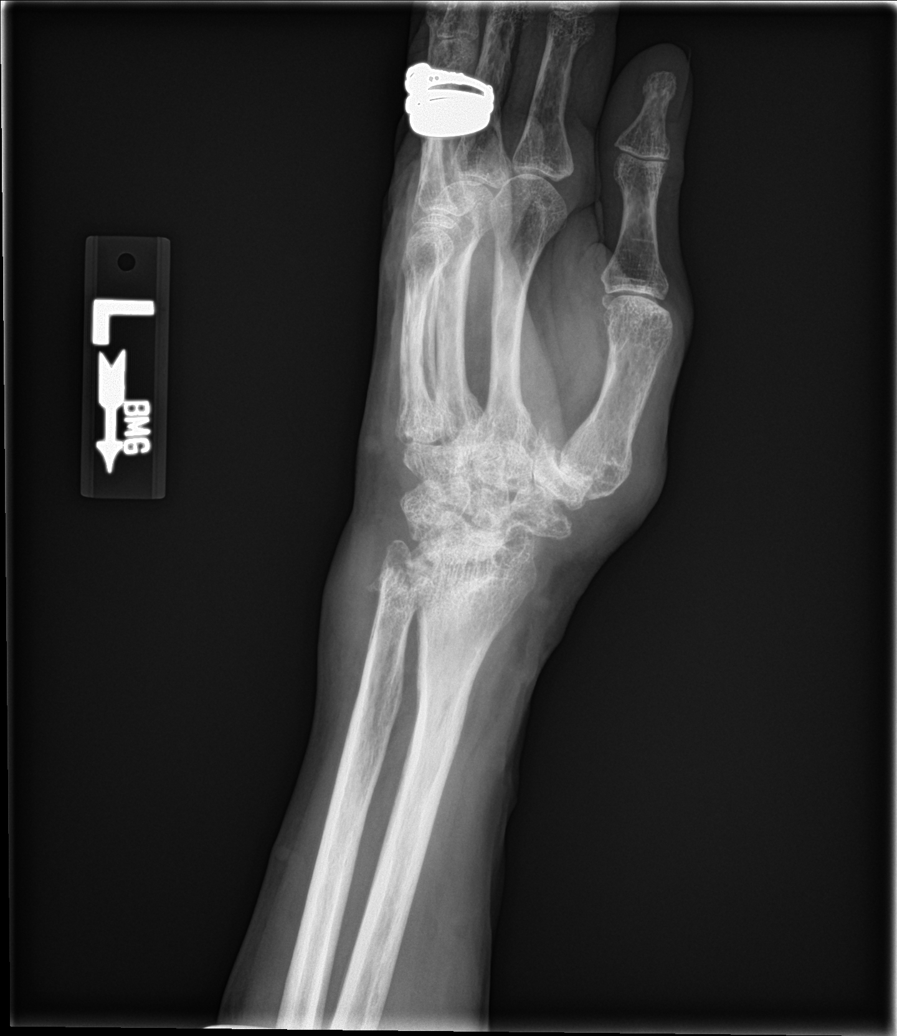

[wrist lat]
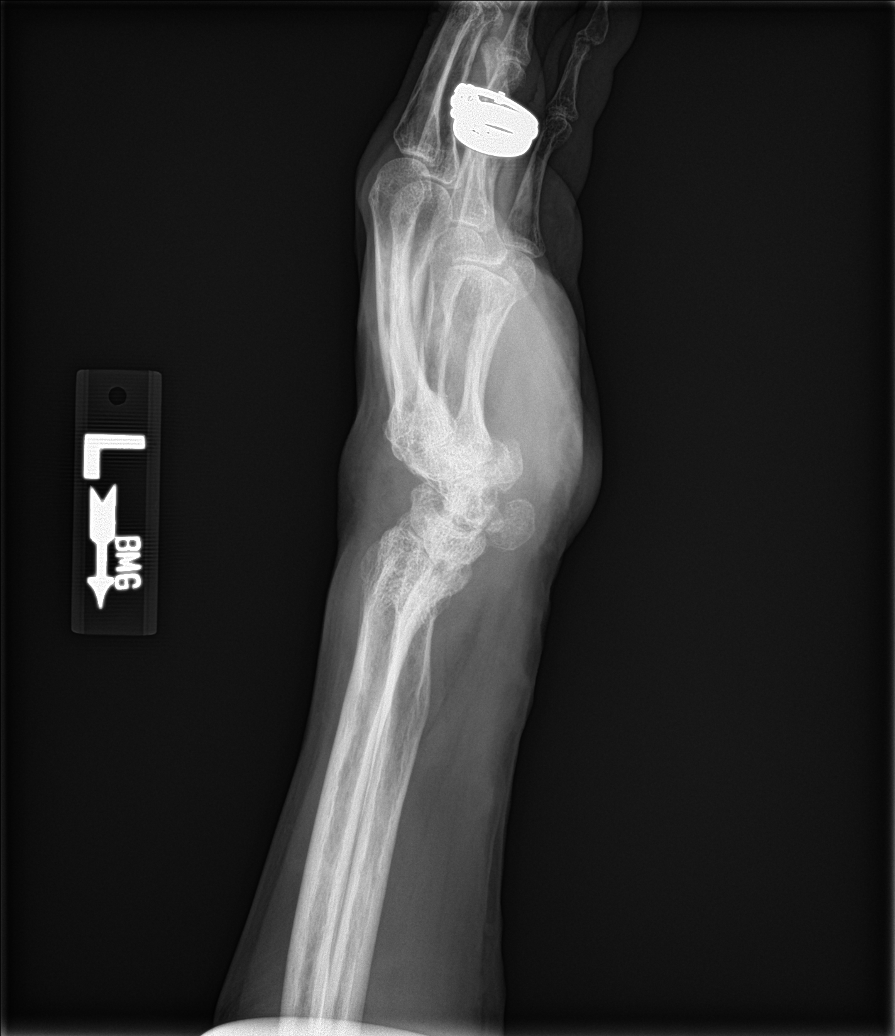

[wrist navicular]
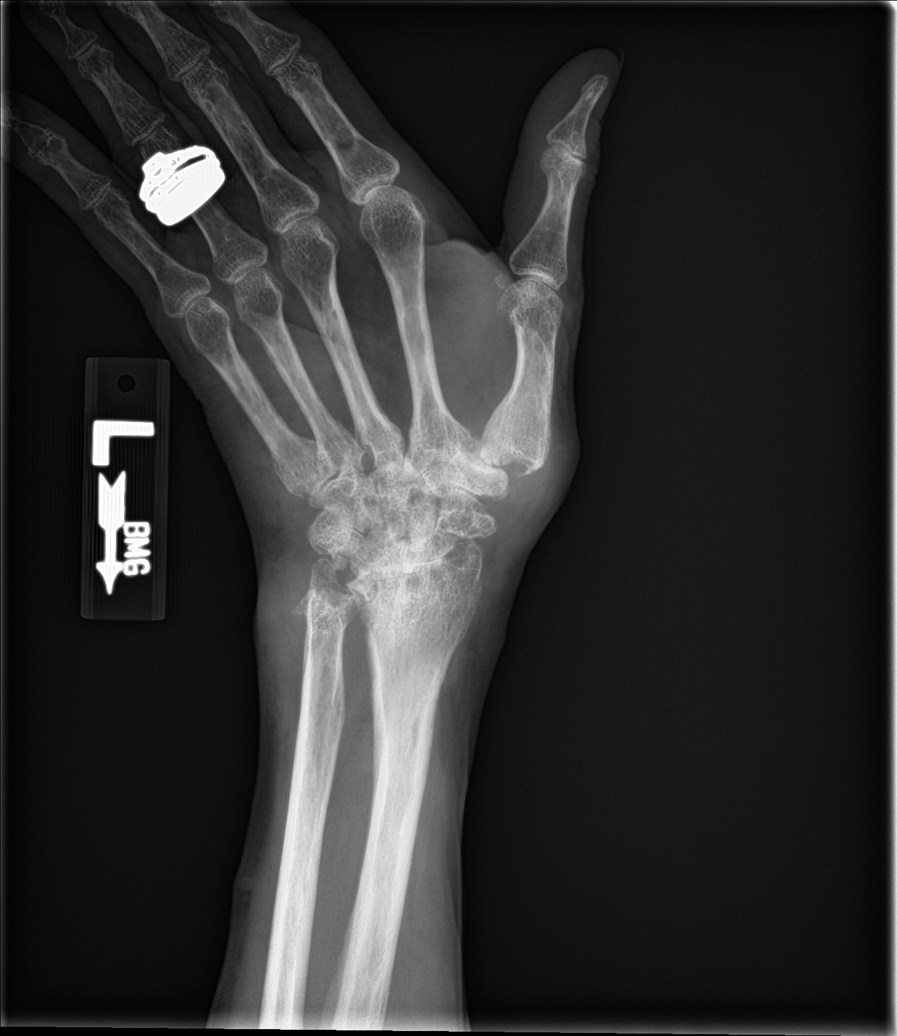

[4 of 4 positions shown; findings below may reference images not displayed]

FINDINGS: Diffuse osteopenia. Prominent diffuse radiocarpal, intercarpal and
carpometacarpal degenerative changes noted. Cystic changes noted
about the subchondral regions of the distal radius, the carpals, and
the base of the metacarpals. Although these changes could be
degenerative [HOSPITAL] induced arthropathy including CPPD and gout
cannot be excluded. Superimposed erosive arthropathy cannot be
excluded. No evidence of fracture. Soft tissue swelling is noted. No
radiopaque foreign body.
IMPRESSION: 1. Diffuse osteopenia. Prominent diffuse degenerative change.
Multiple subchondral cysts are noted about the wrist as described
above. Although these changes may be degenerative [HOSPITAL] induced
arthropathy including CPPD and gout cannot be excluded. Superimposed
erosive arthropathy cannot be excluded.

2. Soft tissue swelling is noted about the left wrist. No evidence
of fracture or dislocation.
